# Patient Record
Sex: Female | Born: 1960 | Race: Black or African American | Hispanic: No | Marital: Single | Smoking: Former smoker
Health system: Southern US, Community
[De-identification: ages and names within clinical notes are randomized; demographics above are authoritative.]

## PROBLEM LIST (undated history)

## (undated) DIAGNOSIS — B029 Zoster without complications: Secondary | ICD-10-CM

## (undated) DIAGNOSIS — I10 Essential (primary) hypertension: Secondary | ICD-10-CM

## (undated) HISTORY — PX: BREAST REDUCTION SURGERY: SHX8

---

## 1998-07-21 ENCOUNTER — Other Ambulatory Visit: Admission: RE | Admit: 1998-07-21 | Discharge: 1998-07-21 | Payer: Self-pay | Admitting: Obstetrics

## 1998-08-09 ENCOUNTER — Ambulatory Visit (HOSPITAL_COMMUNITY): Admission: RE | Admit: 1998-08-09 | Discharge: 1998-08-09 | Payer: Self-pay | Admitting: Obstetrics

## 1999-03-06 ENCOUNTER — Ambulatory Visit (HOSPITAL_COMMUNITY): Admission: RE | Admit: 1999-03-06 | Discharge: 1999-03-06 | Payer: Self-pay | Admitting: Obstetrics

## 1999-03-06 ENCOUNTER — Encounter: Payer: Self-pay | Admitting: Obstetrics

## 2000-01-21 ENCOUNTER — Emergency Department (HOSPITAL_COMMUNITY): Admission: EM | Admit: 2000-01-21 | Discharge: 2000-01-21 | Payer: Self-pay | Admitting: *Deleted

## 2001-03-04 ENCOUNTER — Emergency Department (HOSPITAL_COMMUNITY): Admission: EM | Admit: 2001-03-04 | Discharge: 2001-03-04 | Payer: Self-pay | Admitting: Emergency Medicine

## 2002-02-21 ENCOUNTER — Emergency Department (HOSPITAL_COMMUNITY): Admission: EM | Admit: 2002-02-21 | Discharge: 2002-02-21 | Payer: Self-pay | Admitting: Emergency Medicine

## 2003-07-22 ENCOUNTER — Emergency Department (HOSPITAL_COMMUNITY): Admission: AD | Admit: 2003-07-22 | Discharge: 2003-07-22 | Payer: Self-pay | Admitting: Family Medicine

## 2003-12-05 ENCOUNTER — Emergency Department (HOSPITAL_COMMUNITY): Admission: EM | Admit: 2003-12-05 | Discharge: 2003-12-05 | Payer: Self-pay | Admitting: Emergency Medicine

## 2003-12-06 ENCOUNTER — Emergency Department (HOSPITAL_COMMUNITY): Admission: EM | Admit: 2003-12-06 | Discharge: 2003-12-06 | Payer: Self-pay | Admitting: Emergency Medicine

## 2005-11-11 ENCOUNTER — Inpatient Hospital Stay (HOSPITAL_COMMUNITY): Admission: AD | Admit: 2005-11-11 | Discharge: 2005-11-11 | Payer: Self-pay | Admitting: Obstetrics and Gynecology

## 2007-01-06 ENCOUNTER — Emergency Department (HOSPITAL_COMMUNITY): Admission: EM | Admit: 2007-01-06 | Discharge: 2007-01-06 | Payer: Self-pay | Admitting: Emergency Medicine

## 2014-11-05 ENCOUNTER — Encounter (HOSPITAL_COMMUNITY): Payer: Self-pay | Admitting: Emergency Medicine

## 2014-11-05 ENCOUNTER — Emergency Department (HOSPITAL_COMMUNITY)
Admission: EM | Admit: 2014-11-05 | Discharge: 2014-11-05 | Disposition: A | Discharge status: Home / Self Care | Payer: No Typology Code available for payment source | Attending: Emergency Medicine | Admitting: Emergency Medicine

## 2014-11-05 DIAGNOSIS — Z720 Tobacco use: Secondary | ICD-10-CM | POA: Diagnosis not present

## 2014-11-05 DIAGNOSIS — Y92410 Unspecified street and highway as the place of occurrence of the external cause: Secondary | ICD-10-CM | POA: Insufficient documentation

## 2014-11-05 DIAGNOSIS — S3992XA Unspecified injury of lower back, initial encounter: Secondary | ICD-10-CM | POA: Insufficient documentation

## 2014-11-05 DIAGNOSIS — Y9389 Activity, other specified: Secondary | ICD-10-CM | POA: Diagnosis not present

## 2014-11-05 DIAGNOSIS — Y998 Other external cause status: Secondary | ICD-10-CM | POA: Insufficient documentation

## 2014-11-05 DIAGNOSIS — M549 Dorsalgia, unspecified: Secondary | ICD-10-CM

## 2014-11-05 MED ORDER — HYDROCODONE-ACETAMINOPHEN 5-325 MG PO TABS: 1.0000 | ORAL_TABLET | Freq: Four times a day (QID) | ORAL | Status: DC | PRN
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
## 2014-12-28 ENCOUNTER — Encounter (HOSPITAL_COMMUNITY): Payer: Self-pay | Admitting: *Deleted

## 2014-12-28 ENCOUNTER — Emergency Department (HOSPITAL_COMMUNITY)
Admission: EM | Admit: 2014-12-28 | Discharge: 2014-12-28 | Disposition: A | Discharge status: Home / Self Care | Payer: Medicaid Other | Attending: Emergency Medicine | Admitting: Emergency Medicine

## 2014-12-28 DIAGNOSIS — I10 Essential (primary) hypertension: Secondary | ICD-10-CM | POA: Insufficient documentation

## 2014-12-28 DIAGNOSIS — Z720 Tobacco use: Secondary | ICD-10-CM | POA: Diagnosis not present

## 2014-12-28 DIAGNOSIS — I160 Hypertensive urgency: Secondary | ICD-10-CM

## 2014-12-28 LAB — BASIC METABOLIC PANEL
ANION GAP: 12 (ref 5–15)
BUN: 18 mg/dL (ref 6–23)
CHLORIDE: 100 mmol/L (ref 96–112)
CO2: 25 mmol/L (ref 19–32)
CREATININE: 1.1 mg/dL (ref 0.50–1.10)
Calcium: 9.8 mg/dL (ref 8.4–10.5)
GFR, EST AFRICAN AMERICAN: 66 mL/min — AB (ref >90)
GFR, EST NON AFRICAN AMERICAN: 57 mL/min — AB (ref >90)
Glucose, Bld: 125 mg/dL — ABNORMAL HIGH (ref 70–99)
Potassium: 4 mmol/L (ref 3.5–5.1)
Sodium: 137 mmol/L (ref 135–145)

## 2014-12-28 LAB — CBC
HCT: 39.5 % (ref 36.0–46.0)
Hemoglobin: 13.4 g/dL (ref 12.0–15.0)
MCH: 31.8 pg (ref 26.0–34.0)
MCHC: 33.9 g/dL (ref 30.0–36.0)
MCV: 93.8 fL (ref 78.0–100.0)
PLATELETS: 291 10*3/uL (ref 150–400)
RBC: 4.21 MIL/uL (ref 3.87–5.11)
RDW: 13 % (ref 11.5–15.5)
WBC: 6.4 10*3/uL (ref 4.0–10.5)

## 2014-12-28 LAB — I-STAT TROPONIN, ED: Troponin i, poc: 0 ng/mL (ref 0.00–0.08)

## 2014-12-28 MED ORDER — LISINOPRIL 10 MG PO TABS
10.0000 mg | ORAL_TABLET | Freq: Once | ORAL | Status: AC
  Administered 2014-12-28: 10 mg via ORAL
  Filled 2014-12-28: qty 1

## 2014-12-28 MED ORDER — LISINOPRIL-HYDROCHLOROTHIAZIDE 10-12.5 MG PO TABS
1.0000 | ORAL_TABLET | Freq: Every day | ORAL | Status: DC
Start: 2014-12-28 — End: 2022-12-22

## 2014-12-28 NOTE — ED Notes (Signed)
NAD at this time. Pt is stable and leaving with her friend. 

## 2014-12-28 NOTE — ED Provider Notes (Signed)
Medical screening examination/treatment/procedure(s) were performed by non-physician practitioner and as supervising physician I was immediately available for consultation/collaboration.   EKG Interpretation   Date/Time:  Tuesday December 28 2014 15:43:36 EDT Ventricular Rate:  66 PR Interval:  138 QRS Duration: 78 QT Interval:  406 QTC Calculation: 425 R Axis:   -12 Text Interpretation:  Normal sinus rhythm Biatrial enlargement Anterior  infarct , age undetermined Abnormal ECG No old tracing to compare  Confirmed by Rollan Roger  MD-J, Lamonda Noxon ([id number]) on 12/28/2014 5:54:49 PM      Pt has asymptomatic hypertension.  Labs unremarkable. Reviewed prior visit and she was hypertensive last month.  Will start on htn meds.  Discussed outpatient treatment and follow up with patient.  Linwood DibblesJon Shannara Winbush, MD 12/28/14 [phone]

## 2014-12-28 NOTE — ED Provider Notes (Signed)
CSN: [id number]     Arrival date & time 12/28/14  1516 History   First MD Initiated Contact with Patient 12/28/14 1744     Chief Complaint  Patient presents with  . Hypertension     (Consider location/radiation/quality/duration/timing/severity/associated sxs/prior Treatment) Patient is a 54 y.o. female presenting with hypertension. The history is provided by the patient and the spouse. No language interpreter was used.  Hypertension  Ms. Printy is a 54 y.o black female who presents for hypertension that was noticed by her chiropractor during an appointment today. She has been asymptomatic and states she was unaware that her blood pressure was high. Nothing makes it better or worse and she has not taken anything for it prior to arrival.  She has been having some headaches on and off for the past week. She denies any fever, chest pain, vision changes, vertigo, nausea, vomiting, shortness of breath, abdominal pain, or leg swelling.    History reviewed. No pertinent past medical history. History reviewed. No pertinent past surgical history. History reviewed. No pertinent family history. History  Substance Use Topics  . Smoking status: Current Every Day Smoker  . Smokeless tobacco: Not on file  . Alcohol Use: Yes   OB History    No data available     Review of Systems  All other systems reviewed and are negative.     Allergies  Review of patient's allergies indicates no known allergies.  Home Medications   Prior to Admission medications   Medication Sig Start Date End Date Taking? Authorizing Provider  HYDROcodone-acetaminophen (NORCO/VICODIN) 5-325 MG per tablet Take 1-2 tablets by mouth every 6 (six) hours as needed for moderate pain or severe pain. 11/05/14   Roxy Horseman, PA-C  lisinopril-hydrochlorothiazide (ZESTORETIC) 10-12.5 MG per tablet Take 1 tablet by mouth daily. 12/28/14   [employer] Patel-Mills, PA-C   BP 173/97 mmHg  Pulse 60  Temp(Src) 98 F (36.7 C) (Oral)   Resp 18  Ht  (1.626 m)  Wt 149 lb (67.586 kg)  BMI 25.56 kg/m2  SpO2 98% Physical Exam  Constitutional: She is oriented to person, place, and time. She appears well-developed and well-nourished.  HENT:  Head: Normocephalic and atraumatic.  Eyes: Conjunctivae and EOM are normal.  Neck: Normal range of motion. Neck supple.  Cardiovascular: Normal rate, regular rhythm and normal heart sounds.   Pulmonary/Chest: Effort normal and breath sounds normal.  Abdominal: Soft. There is no tenderness.  Musculoskeletal: Normal range of motion.  Neurological: She is alert and oriented to person, place, and time. She has normal strength. No cranial nerve deficit or sensory deficit. Coordination normal.  Sensation is good through bilateral upper and lower extremities.   Skin: Skin is warm and dry.  Nursing note and vitals reviewed.   ED Course  Procedures (including critical care time) Labs Review Labs Reviewed  BASIC METABOLIC PANEL - Abnormal; Notable for the following:    Glucose, Bld 125 (*)    GFR calc non Af Amer 57 (*)    GFR calc Af Amer 66 (*)    All other components within normal limits  CBC  I-STAT TROPOININ, ED    Imaging Review No results found.   EKG Interpretation   Date/Time:  Tuesday December 28 2014 15:43:36 EDT Ventricular Rate:  66 PR Interval:  138 QRS Duration: 78 QT Interval:  406 QTC Calculation: 425 R Axis:   -12 Text Interpretation:  Normal sinus rhythm Biatrial enlargement Anterior  infarct , age undetermined Abnormal ECG No  old tracing to compare  Confirmed by [facility] CenterKNAPP  MD-J, JON [phone] on 12/28/2014 5:54:49 PM      MDM   Final diagnoses:  Asymptomatic hypertensive urgency  Patient presents for asymptomatic hypertension that was noted by her chiropracter today.  She states she has not had this in the past.  Her exam is normal.  Labs are unremarkable. EKG is normal.  I have treated her with lisinopril here and will send her home with zestoretic.   She will f/u up with her pcp and agrees with the plan.      Catha GosselinHanna Patel-Mills, PA-C 12/29/14 0030

## 2014-12-28 NOTE — Discharge Instructions (Signed)
Hypertension °Hypertension, commonly called high blood pressure, is when the force of blood pumping through your arteries is too strong. Your arteries are the blood vessels that carry blood from your heart throughout your body. A blood pressure reading consists of a higher number over a lower number, such as 110/72. The higher number (systolic) is the pressure inside your arteries when your heart pumps. The lower number (diastolic) is the pressure inside your arteries when your heart relaxes. Ideally you want your blood pressure below 120/80. °Hypertension forces your heart to work harder to pump blood. Your arteries may become narrow or stiff. Having hypertension puts you at risk for heart disease, stroke, and other problems.  °RISK FACTORS °Some risk factors for high blood pressure are controllable. Others are not.  °Risk factors you cannot control include:  °· Race. You may be at higher risk if you are African American. °· Age. Risk increases with age. °· Gender. Men are at higher risk than women before age 45 years. After age 65, women are at higher risk than men. °Risk factors you can control include: °· Not getting enough exercise or physical activity. °· Being overweight. °· Getting too much fat, sugar, calories, or salt in your diet. °· Drinking too much alcohol. °SIGNS AND SYMPTOMS °Hypertension does not usually cause signs or symptoms. Extremely high blood pressure (hypertensive crisis) may cause headache, anxiety, shortness of breath, and nosebleed. °DIAGNOSIS  °To check if you have hypertension, your health care provider will measure your blood pressure while you are seated, with your arm held at the level of your heart. It should be measured at least twice using the same arm. Certain conditions can cause a difference in blood pressure between your right and left arms. A blood pressure reading that is higher than normal on one occasion does not mean that you need treatment. If one blood pressure reading  is high, ask your health care provider about having it checked again. °TREATMENT  °Treating high blood pressure includes making lifestyle changes and possibly taking medicine. Living a healthy lifestyle can help lower high blood pressure. You may need to change some of your habits. °Lifestyle changes may include: °· Following the DASH diet. This diet is high in fruits, vegetables, and whole grains. It is low in salt, red meat, and added sugars. °· Getting at least 2½ hours of brisk physical activity every week. °· Losing weight if necessary. °· Not smoking. °· Limiting alcoholic beverages. °· Learning ways to reduce stress. ° If lifestyle changes are not enough to get your blood pressure under control, your health care provider may prescribe medicine. You may need to take more than one. Work closely with your health care provider to understand the risks and benefits. °HOME CARE INSTRUCTIONS °· Have your blood pressure rechecked as directed by your health care provider.   °· Take medicines only as directed by your health care provider. Follow the directions carefully. Blood pressure medicines must be taken as prescribed. The medicine does not work as well when you skip doses. Skipping doses also puts you at risk for problems.   °· Do not smoke.   °· Monitor your blood pressure at home as directed by your health care provider.  °SEEK MEDICAL CARE IF:  °· You think you are having a reaction to medicines taken. °· You have recurrent headaches or feel dizzy. °· You have swelling in your ankles. °· You have trouble with your vision. °SEEK IMMEDIATE MEDICAL CARE IF: °· You develop a severe headache or confusion. °·   You have unusual weakness, numbness, or feel faint. °· You have severe chest or abdominal pain. °· You vomit repeatedly. °· You have trouble breathing. °MAKE SURE YOU:  °· Understand these instructions. °· Will watch your condition. °· Will get help right away if you are not doing well or get worse. °Document  Released: 09/17/2005 Document Revised: 02/01/2014 Document Reviewed: 07/10/2013 °ExitCare® Patient Information ©2015 [employer]. This information is not intended to replace advice given to you by your health care provider. Make sure you discuss any questions you have with your health care provider. ° ° °Emergency Department Resource Guide °1) Find a Doctor and Pay Out of Pocket °Although you won't have to find out who is covered by your insurance plan, it is a good idea to ask around and get recommendations. You will then need to call the office and see if the doctor you have chosen will accept you as a new patient and what types of options they offer for patients who are self-pay. Some doctors offer discounts or will set up payment plans for their patients who do not have insurance, but you will need to ask so you aren't surprised when you get to your appointment. ° °2) Contact Your Local Health Department °Not all health departments have doctors that can see patients for sick visits, but many do, so it is worth a call to see if yours does. If you don't know where your local health department is, you can check in your phone book. The CDC also has a tool to help you locate your state's health department, and many state websites also have listings of all of their local health departments. ° °3) Find a Walk-in Clinic °If your illness is not likely to be very severe or complicated, you may want to try a walk in clinic. These are popping up all over the country in pharmacies, drugstores, and shopping centers. They're usually staffed by nurse practitioners or physician assistants that have been trained to treat common illnesses and complaints. They're usually fairly quick and inexpensive. However, if you have serious medical issues or chronic medical problems, these are probably not your best option. ° °No Primary Care Doctor: °- Call [facility] at  832-8000 - they can help you locate a primary care doctor that   accepts your insurance, provides certain services, etc. °- Physician Referral Service- 1-800-533-3463 ° °Chronic Pain Problems: °Organization         Address  Phone   Notes  °[facility]  (336) 297-2271 Patients need to be referred by their primary care doctor.  ° °Medication Assistance: °Organization         Address  Phone   Notes  °[place] [facility] [address] °[place], [place] [address] (336) 641-8030 --Must be a resident of [place] °-- Must have NO insurance coverage whatsoever (no Medicaid/ Medicare, etc.) °-- The pt. MUST have a primary care doctor that directs their care regularly and follows them in the community °  °[facility]  (866) 331-1348   °[employer]  (888) 892-1162   ° °Agencies that provide inexpensive medical care: °Organization         Address  Phone   Notes  °[facility]  (336) 832-8035   °[facility]    (336) 832-7272   °[facility] [address] °[place], [place] [address] (336) 832-4777   °[facility] [address], °  [place] (336) 271-4999   °[facility]    (336) 373-0678   °[facility]    (336) 272-1050   °[facility] ° [address], [place] Phone:  (336) 832-4444, Fax:  (336) 832-4440 Hours of Operation:  9 am - 6 pm, M-F.  Also accepts Medicaid/Medicare and self-pay.  °[facility] ° [address], [place] Phone: (336) 832-3150, Fax: (336) 832-3151. Hours of Operation:  8:30 am - 5:30 pm, M-F.  Also accepts Medicaid and self-pay.  °[facility] [place] [address], [place] Phone: (336) 878-6027   °[facility] [address], [place], [place] (336)723-1848, Ext. 123 Mondays & Thursdays: 7-9 AM.  First 15 patients are seen on a first come, first serve basis. °  ° °Medicaid-accepting [place] Providers: ° °Organization          Address  Phone   Notes  °[facility] [address], [place] (336) 641-2100 Also accepts self-pay patients.  °[facility] [address], [place] ° (336) 856-9996   °[facility] [address], [place] (336) 288-8857   °[facility] [address] [place] [address], [place] (336) 299-7000   °Veita Bland [address], [place]  ° (336) 373-1557 Only accepts [place] Access Medicaid patients after they have their name applied to their card.  ° °Self-Pay (no insurance) in [place]: ° °Organization         Address  Phone   Notes  °Sickle Cell Patients, [facility] [address], [place] (336) 832-1970   ° [facility] [address], [place] (336) 832-4400   ° [facility] ° [address], [place] (336) 992-4800   °[facility]/Dr. Osei-Bonsu ° [address] [place] [address], [place] or [address], [place] (336) 841-8500 Phone number for both [place] and [place] locations is the same.  °[facility] [address], [place] (336) 299-0000   °[facility] [address] [place] [address], [place] or [address] (336) 852-7530 °(336) 878-2260   °[facility] [address], [place] (336) 350-1642, phone; (336) 294-5005, fax Sees patients 1st and 3rd Saturday of every month.  Must not qualify for public or private insurance (i.e. Medicaid, Medicare, [place] Health Choice, Veterans' Benefits) • Household income should be no more than 200% of the poverty level •The clinic cannot treat you if you are pregnant or think you are pregnant • Sexually transmitted diseases are not treated at the clinic.  ° ° °Dental Care: °Organization         Address  Phone  Notes  °[facility] [address],   (336) 641-6152 Accepts children up to age 21 who are enrolled in Medicaid or [place] Health Choice; pregnant women with a Medicaid card; and children who have applied for Medicaid or [place] Health Choice, but were declined, whose parents can pay a reduced fee at time of service.  °[facility] [place]  [address], [place] (336) 641-7733 Accepts children up to age 21 who are enrolled in Medicaid or [place] Health Choice; pregnant women with a Medicaid card; and children who have applied for Medicaid or [place]   Health Choice, but were declined, whose parents can pay a reduced fee at time of service.  °[facility] ° [address], [place] (336) 641-4533 Patients are seen by appointment only. Walk-ins are not accepted. [facility] will see patients 18 years of age and older. °Monday - Tuesday (8am-5pm) °Most Wednesdays (8:30-5pm) °$30 per visit, cash only  °[facility] ° [address], [place] (336) 641-4533 Patients are seen by appointment only. Walk-ins are not accepted. [facility] will see patients 18 years of age and older. °One Wednesday Evening (Monthly: Volunteer Based).  $30 per visit, cash only  °[facility]  (919) [phone] for adults; Children under age 4, call Graduate Pediatric Dentistry at ([phone]) [phone]. Children aged 4-14, please call ([phone]) [phone] to request a pediatric application. ° Dental services are provided in all areas of dental care including fillings, crowns and bridges, complete and partial dentures, implants, gum treatment, root canals, and extractions. Preventive care is also provided. Treatment is provided to both adults and children. °Patients are selected via a lottery and there is often a waiting list. °  °[facility] [address], °[place] ° (336) 763-8833 [email or web address] °  °[facility] [address], [place], [place]  (336)723-1848, Ext. 123 Second and Fourth Thursday of each month, opens at 6:30 AM; Clinic ends at 9 AM.  Patients are seen on a first-come first-served basis, and a limited number are seen during each clinic.  ° °[facility] ° [address], [place], [place] (336) 723-7904   Eligibility Requirements °You must have lived in [place], [place], or [place] counties for at least the last three months. °  You cannot be eligible for state or federal sponsored healthcare insurance, including Veterans Administration, Medicaid, or Medicare. °  You generally cannot be eligible for healthcare insurance through your employer.  °  How to apply: °Eligibility screenings are held every Tuesday and Wednesday afternoon from 1:00 pm until 4:00 pm. You do not need an appointment for the interview!  °[facility] [address], [place], [place] 336-631-2330   °[facility]  336-342-8273   °[facility]  336-703-3100   °[facility]  336-570-6415   ° °Behavioral Health Resources in the Community: °Intensive Outpatient Programs °Organization         Address  Phone  Notes  °[facility] [address], [place], [place] 336-878-6098   °[facility] [address], [place], [place] 336-832-9800   °[facility] [address], [place], [place] ° 336-882-2125   °[facility] [address],  °[place], [place] 1-800-853-5163 or 336-641-4981   °Substance Abuse Resources °Organization         Address  Phone  Notes  °[facility]  336-882-2125   °[facility]  336-784-9470   °[facility]  336-285-9073   °[facility]  336-845-3988   °Residential & Outpatient Substance Abuse Program  1-800-659-3381   °Psychological Services °Organization         Address  Phone  Notes  °[facility]  336- 832-9600   °[facility]  336- 378-7881    °[facility] [address],  1-800-853-5163 or 336-641-4981   ° °Mobile Crisis Teams °Organization         Address  Phone    Notes  °[facility], Mobile Crisis Care Unit  1-877-626-1772   °Assertive °[facility] ° [address] [place], [place] 336-834-9664   °Sharon DeEsch [address] °[place] 336-554-5454   ° °Self-Help/Support Groups °Organization         Address  Phone             Notes  °[facility] - variety of support groups  336- 373-1402 Call for more information  °Narcotics Anonymous (NA), [facility] [address], °[place]  2 meetings at this location  ° °Residential Treatment Programs °Organization         Address  Phone  Notes  °[facility] [address],    °[place]  1-866-801-8205   °[facility] ° [address], [place], [place] 704-293-8524   °[facility] [address], [place] 336-845-3988 Admissions: 8am-3pm M-F  °[facility] [address]    °[place], [place] 336-841-1104   °[facility] [address], [place], [place] 336-379-7146   °[facility] [address]  °[place], [place] 336-285-9073   °[facility] - Intensive Outpatient [address], [place], [place] 336-852-3033   °[facility] [address]  °[place], [place] 1-877-615-2722 or 336-784-9470   °[facility] ([facility] [address] [place], [place] 336-227-7417 Accepts Medicaid  °[facility] [address]  ° [place] 1-800-659-3381 Substance Abuse/Addiction Treatment  ° °[place] Behavioral Health Resources °Organization         Address  Phone  Notes  °[facility]  (888) 581-9988   °Julie Brannon, PhD [address] [place], [place]   (336) 349-5553 or (336) 951-0000   °[facility]   [address] °[place], [place] (336) 349-4454   °[facility] [address], [place], [place] (336) 342-8316 Insurance/Medicaid/sponsorship through [facility]  °[facility] [address]                                    [place], [place] (336) 342-8316 Therapy/tele-psych/case  °[facility] [address]  ° [place], [place] (336) 349-2233    °Dr. Arfeen  (336) 349-4544   °[facility] [place]  [employer] [place] [facility] 1) [address],  °2) [address], [place] °3)  [address], [place] (336) 349-3220 °(336) 342-7768 ° °(336) 342-8140   °[place] [facility] (336) 342-1394 or (336) 342-3537 (After Hours)    ° ° ° °

## 2014-12-28 NOTE — ED Notes (Signed)
Pt reports going to chiropractor today, was sent here due to htn. Denies hx of htn. Having headache and blurred vision. No acute distress noted at this time.

## 2015-01-05 ENCOUNTER — Emergency Department (HOSPITAL_COMMUNITY): Payer: Medicaid Other

## 2015-01-05 ENCOUNTER — Encounter (HOSPITAL_COMMUNITY): Payer: Self-pay | Admitting: Family Medicine

## 2015-01-05 ENCOUNTER — Emergency Department (HOSPITAL_COMMUNITY)
Admission: EM | Admit: 2015-01-05 | Discharge: 2015-01-05 | Disposition: A | Discharge status: Home / Self Care | Payer: Medicaid Other | Attending: Emergency Medicine | Admitting: Emergency Medicine

## 2015-01-05 DIAGNOSIS — Y9289 Other specified places as the place of occurrence of the external cause: Secondary | ICD-10-CM | POA: Diagnosis not present

## 2015-01-05 DIAGNOSIS — W01198A Fall on same level from slipping, tripping and stumbling with subsequent striking against other object, initial encounter: Secondary | ICD-10-CM | POA: Insufficient documentation

## 2015-01-05 DIAGNOSIS — Z79899 Other long term (current) drug therapy: Secondary | ICD-10-CM | POA: Diagnosis not present

## 2015-01-05 DIAGNOSIS — Y998 Other external cause status: Secondary | ICD-10-CM | POA: Insufficient documentation

## 2015-01-05 DIAGNOSIS — S0083XA Contusion of other part of head, initial encounter: Secondary | ICD-10-CM | POA: Diagnosis not present

## 2015-01-05 DIAGNOSIS — Y9389 Activity, other specified: Secondary | ICD-10-CM | POA: Insufficient documentation

## 2015-01-05 DIAGNOSIS — S060X0A Concussion without loss of consciousness, initial encounter: Secondary | ICD-10-CM | POA: Diagnosis not present

## 2015-01-05 DIAGNOSIS — Z720 Tobacco use: Secondary | ICD-10-CM | POA: Diagnosis not present

## 2015-01-05 DIAGNOSIS — S20212A Contusion of left front wall of thorax, initial encounter: Secondary | ICD-10-CM | POA: Diagnosis not present

## 2015-01-05 DIAGNOSIS — S0990XA Unspecified injury of head, initial encounter: Secondary | ICD-10-CM | POA: Diagnosis present

## 2015-01-05 LAB — I-STAT CHEM 8, ED
BUN: 20 mg/dL (ref 6–23)
CHLORIDE: 103 mmol/L (ref 96–112)
Calcium, Ion: 1.19 mmol/L (ref 1.12–1.23)
Creatinine, Ser: 1.1 mg/dL (ref 0.50–1.10)
GLUCOSE: 111 mg/dL — AB (ref 70–99)
HEMATOCRIT: 45 % (ref 36.0–46.0)
Hemoglobin: 15.3 g/dL — ABNORMAL HIGH (ref 12.0–15.0)
POTASSIUM: 3.7 mmol/L (ref 3.5–5.1)
Sodium: 141 mmol/L (ref 135–145)
TCO2: 24 mmol/L (ref 0–100)

## 2015-01-05 NOTE — ED Notes (Signed)
Pt placed on 2L because on room air pt destat to 80%. Pt now at 100% with 2L

## 2015-01-05 NOTE — ED Notes (Signed)
Patient transported to CT 

## 2015-01-05 NOTE — Discharge Instructions (Signed)
Concussion Take Tylenol or Advil for pain. See your primary care physician if you don't feel back to normal by Monday, 01/10/2015 A concussion is a brain injury. It is caused by:  A hit to the head.  A quick and sudden movement (jolt) of the head or neck. A concussion is usually not life threatening. Even so, it can cause serious problems. If you had a concussion before, you may have concussion-like problems after a hit to your head. HOME CARE General Instructions  Follow your doctor's directions carefully.  Take medicines only as told by your doctor.  Only take medicines your doctor says are safe.  Do not drink alcohol until your doctor says it is okay. Alcohol and some drugs can slow down healing. They can also put you at risk for further injury.  If you are having trouble remembering things, write them down.  Try to do one thing at a time if you get distracted easily. For example, do not watch TV while making dinner.  Talk to your family members or close friends when making important decisions.  Follow up with your doctor as told.  Watch your symptoms. Tell others to do the same. Serious problems can sometimes happen after a concussion. Older adults are more likely to have these problems.  Tell your teachers, school nurse, school counselor, coach, Event organiserathletic trainer, or work Production designer, theatre/television/filmmanager about your concussion. Tell them about what you can or cannot do. They should watch to see if:  It gets even harder for you to pay attention or concentrate.  It gets even harder for you to remember things or learn new things.  You need more time than normal to finish things.  You become annoyed (irritable) more than before.  You are not able to deal with stress as well.  You have more problems than before.  Rest. Make sure you:  Get plenty of sleep at night.  Go to sleep early.  Go to bed at the same time every day. Try to wake up at the same time.  Rest during the day.  Take naps  when you feel tired.  Limit activities where you have to think a lot or concentrate. These include:  Doing homework.  Doing work related to a job.  Watching TV.  Using the computer. Returning To Your Regular Activities Return to your normal activities slowly, not all at once. You must give your body and brain enough time to heal.   Do not play sports or do other athletic activities until your doctor says it is okay.  Ask your doctor when you can drive, ride a bicycle, or work other vehicles or machines. Never do these things if you feel dizzy.  Ask your doctor about when you can return to work or school. Preventing Another Concussion It is very important to avoid another brain injury, especially before you have healed. In rare cases, another injury can lead to permanent brain damage, brain swelling, or death. The risk of this is greatest during the first 7-10 days after your injury. Avoid injuries by:   Wearing a seat belt when riding in a car.  Not drinking too much alcohol.  Avoiding activities that could lead to a second concussion (such as contact sports).  Wearing a helmet when doing activities like:  Biking.  Skiing.  Skateboarding.  Skating.  Making your home safer by:  Removing things from the floor or stairways that could make you trip.  Using grab bars in bathrooms and handrails by  stairs.  Placing non-slip mats on floors and in bathtubs.  Improve lighting in dark areas. GET HELP IF:  It gets even harder for you to pay attention or concentrate.  It gets even harder for you to remember things or learn new things.  You need more time than normal to finish things.  You become annoyed (irritable) more than before.  You are not able to deal with stress as well.  You have more problems than before.  You have problems keeping your balance.  You are not able to react quickly when you should. Get help if you have any of these problems for more than 2  weeks:   Lasting (chronic) headaches.  Dizziness or trouble balancing.  Feeling sick to your stomach (nausea).  Seeing (vision) problems.  Being affected by noises or light more than normal.  Feeling sad, low, down in the dumps, blue, gloomy, or empty (depressed).  Mood changes (mood swings).  Feeling of fear or nervousness about what may happen (anxiety).  Feeling annoyed.  Memory problems.  Problems concentrating or paying attention.  Sleep problems.  Feeling tired all the time. GET HELP RIGHT AWAY IF:   You have bad headaches or your headaches get worse.  You have weakness (even if it is in one hand, leg, or part of the face).  You have loss of feeling (numbness).  You feel off balance.  You keep throwing up (vomiting).  You feel tired.  One black center of your eye (pupil) is larger than the other.  You twitch or shake violently (convulse).  Your speech is not clear (slurred).  You are more confused, easily angered (agitated), or annoyed than before.  You have more trouble resting than before.  You are unable to recognize people or places.  You have neck pain.  It is difficult to wake you up.  You have unusual behavior changes.  You pass out (lose consciousness). MAKE SURE YOU:   Understand these instructions.  Will watch your condition.  Will get help right away if you are not doing well or get worse. Document Released: 09/05/2009 Document Revised: 02/01/2014 Document Reviewed: 04/09/2013 [facility] Patient Information 2015 [place], [place]. This information is not intended to replace advice given to you by your health care provider. Make sure you discuss any questions you have with your health care provider.

## 2015-01-05 NOTE — ED Provider Notes (Signed)
CSN: [id number]     Arrival date & time 01/05/15  1314 History   None    Chief Complaint  Patient presents with  . Fall     (Consider location/radiation/quality/duration/timing/severity/associated sxs/prior Treatment) HPI Patient reports she fell 3 AM on 01/01/2014 when she got up out of bed. She lost her balance and struck her head and her chest and lay on the floor until daylight. She reports feeling lightheaded since the fall. Denies abdominal pain no shortness of breath. No treatment prior to coming here. No other associated symptoms.. Chest pain is worse with lying in certain positions improved with other positions. Discomfort is presently mild.  History reviewed. No pertinent past medical history. past medical history hypertension History reviewed. No pertinent past surgical history. History reviewed. No pertinent family history. History  Substance Use Topics  . Smoking status: Current Every Day Smoker  . Smokeless tobacco: Not on file  . Alcohol Use: Yes   OB History    No data available     Review of Systems  Cardiovascular: Positive for chest pain.  Neurological: Positive for light-headedness and headaches.  All other systems reviewed and are negative.     Allergies  Review of patient's allergies indicates no known allergies.  Home Medications   Prior to Admission medications   Medication Sig Start Date End Date Taking? Authorizing Provider  lisinopril-hydrochlorothiazide (ZESTORETIC) 10-12.5 MG per tablet Take 1 tablet by mouth daily. 12/28/14  Yes Hanna Patel-Mills, PA-C  HYDROcodone-acetaminophen (NORCO/VICODIN) 5-325 MG per tablet Take 1-2 tablets by mouth every 6 (six) hours as needed for moderate pain or severe pain. Patient not taking: Reported on 01/05/2015 11/05/14   Roxy Horsemanobert Browning, PA-C   BP 171/78 mmHg  Pulse 71  Temp(Src) 97.9 F (36.6 C) (Oral)  Resp 18  SpO2 99% Physical Exam  Constitutional: She is oriented to person, place, and time. She appears  well-developed and well-nourished.  HENT:  Mildly swollen tender overlying right eyebrow otherwise Mechele Claudehomas Fike atraumatic  Eyes: Conjunctivae are normal. Pupils are equal, round, and reactive to light.  Neck: Neck supple. No tracheal deviation present. No thyromegaly present.  Cardiovascular: Normal rate and regular rhythm.   No murmur heard. Pulmonary/Chest: Effort normal and breath sounds normal.  3 cm reddish ecchymosis at left parasternal area with mild tenderness corresponding. No crepitance no flail  Abdominal: Soft. Bowel sounds are normal. She exhibits no distension. There is no tenderness.  Musculoskeletal: Normal range of motion. She exhibits no edema or tenderness.  Entire spine nontender.  Neurological: She is alert and oriented to person, place, and time. She has normal reflexes. No cranial nerve deficit. Coordination normal.  Glasgow Coma Score 15 gait normal Romberg normal pronator drift normal DTR symmetric bilaterally knee jerk ankle jerk and biceps toes downward going bilaterally  Skin: Skin is warm and dry. No rash noted.  Psychiatric: She has a normal mood and affect.  Nursing note and vitals reviewed.   ED Course  Procedures (including critical care time) Labs Review Labs Reviewed - No data to display  Imaging Review No results found.   EKG Interpretation None     4:40 PM patient resting comfortably. Results for orders placed or performed during the hospital encounter of 01/05/15  I-stat chem 8, ed  Result Value Ref Range   Sodium 141 135 - 145 mmol/L   Potassium 3.7 3.5 - 5.1 mmol/L   Chloride 103 96 - 112 mmol/L   BUN 20 6 - 23 mg/dL   Creatinine,  Ser 1.10 0.50 - 1.10 mg/dL   Glucose, Bld 161 (H) 70 - 99 mg/dL   Calcium, Ion 0.96 0.45 - 1.23 mmol/L   TCO2 24 0 - 100 mmol/L   Hemoglobin 15.3 (H) 12.0 - 15.0 g/dL   HCT 40.9 81.1 - 91.4 %   Dg Chest 2 View  01/05/2015   CLINICAL DATA:  Dizziness and shortness of breath; cough  EXAM: CHEST  2 VIEW   COMPARISON:  None.  FINDINGS: Lungs are clear. Heart size and pulmonary vascularity are normal. No adenopathy. No bone lesions.  IMPRESSION: No edema or consolidation.   Electronically Signed   By: Bretta Bang III M.D.   On: 01/05/2015 16:08   Ct Head Wo Contrast  01/05/2015   CLINICAL DATA:  Dizziness since Sunday. Larey Seat while going to the bathroom. Loss of consciousness. Injury to the head with hematoma along the right forehead.  EXAM: CT HEAD WITHOUT CONTRAST  TECHNIQUE: Contiguous axial images were obtained from the base of the skull through the vertex without intravenous contrast.  COMPARISON:  Maxillofacial CT from 12/05/2003  FINDINGS: The brainstem, cerebellum, cerebral peduncles, thalamus, basal ganglia, basilar cisterns, and ventricular system appear within normal limits. No intracranial hemorrhage, mass lesion, or acute CVA.  Chronic left maxillary sinus sinusitis with subtotal opacification of the sinus. The remaining visualized paranasal sinuses appear clear.  IMPRESSION: 1. No acute intracranial findings. 2. Chronic left maxillary sinusitis.   Electronically Signed   By: Gaylyn Rong M.D.   On: 01/05/2015 15:57    MDM  Plan Tylenol or Advil for pain. Note pulse oximetry of 80% on room air is felt to be false reading. Patient maintained pulse oximetry of 98-100% on room air throughout entire ED course. Final diagnoses:  None   plan Tylenol or Advil for pain. Follow-up with PMD if not feeling better in 4 or 5 days Diagnosis #1 fall #2 mild concussion #3 contusion to chest wall      Doug Sou, MD 01/05/15 1642

## 2015-01-05 NOTE — ED Notes (Signed)
Pt here for fall on Sunday. Sts that she tripped and hit right side of head and chest on her dresser. sts feeling a little woozy since then and having pain in head and chest. sts bruise to chest.

## 2015-02-12 ENCOUNTER — Emergency Department (HOSPITAL_COMMUNITY)
Admission: EM | Admit: 2015-02-12 | Discharge: 2015-02-12 | Disposition: A | Discharge status: Home / Self Care | Payer: Medicaid Other | Attending: Emergency Medicine | Admitting: Emergency Medicine

## 2015-02-12 ENCOUNTER — Emergency Department (HOSPITAL_COMMUNITY): Payer: Medicaid Other

## 2015-02-12 ENCOUNTER — Encounter (HOSPITAL_COMMUNITY): Payer: Self-pay | Admitting: *Deleted

## 2015-02-12 DIAGNOSIS — R062 Wheezing: Secondary | ICD-10-CM | POA: Diagnosis not present

## 2015-02-12 DIAGNOSIS — Z79899 Other long term (current) drug therapy: Secondary | ICD-10-CM | POA: Diagnosis not present

## 2015-02-12 DIAGNOSIS — Y939 Activity, unspecified: Secondary | ICD-10-CM | POA: Insufficient documentation

## 2015-02-12 DIAGNOSIS — R0781 Pleurodynia: Secondary | ICD-10-CM | POA: Insufficient documentation

## 2015-02-12 DIAGNOSIS — Z720 Tobacco use: Secondary | ICD-10-CM | POA: Diagnosis not present

## 2015-02-12 DIAGNOSIS — B029 Zoster without complications: Secondary | ICD-10-CM | POA: Diagnosis not present

## 2015-02-12 DIAGNOSIS — S1096XA Insect bite of unspecified part of neck, initial encounter: Secondary | ICD-10-CM | POA: Insufficient documentation

## 2015-02-12 DIAGNOSIS — W57XXXA Bitten or stung by nonvenomous insect and other nonvenomous arthropods, initial encounter: Secondary | ICD-10-CM | POA: Diagnosis not present

## 2015-02-12 DIAGNOSIS — Y929 Unspecified place or not applicable: Secondary | ICD-10-CM | POA: Insufficient documentation

## 2015-02-12 DIAGNOSIS — Y999 Unspecified external cause status: Secondary | ICD-10-CM | POA: Diagnosis not present

## 2015-02-12 DIAGNOSIS — R05 Cough: Secondary | ICD-10-CM | POA: Insufficient documentation

## 2015-02-12 MED ORDER — ACYCLOVIR 400 MG PO TABS: 400.0000 mg | ORAL_TABLET | Freq: Four times a day (QID) | ORAL | Status: DC

## 2015-02-12 MED ORDER — PREDNISONE 20 MG PO TABS: 40.0000 mg | ORAL_TABLET | Freq: Every day | ORAL | Status: DC

## 2015-02-12 MED ORDER — HYDROCODONE-ACETAMINOPHEN 5-325 MG PO TABS: 1.0000 | ORAL_TABLET | Freq: Four times a day (QID) | ORAL | Status: DC | PRN

## 2015-02-12 MED ORDER — HYDROCODONE-ACETAMINOPHEN 5-325 MG PO TABS
1.0000 | ORAL_TABLET | Freq: Once | ORAL | Status: AC
  Administered 2015-02-12: 1 via ORAL
  Filled 2015-02-12: qty 1

## 2015-02-12 MED ORDER — HYDROCORTISONE 1 % EX CREA: TOPICAL_CREAM | CUTANEOUS | Status: DC

## 2015-02-12 NOTE — ED Provider Notes (Signed)
CSN: [id number]     Arrival date & time 02/12/15  2055 History  This chart was scribed for Jaynie Crumbleatyana Alann Avey, PA-C working with Dr. Preston FleetingGlick, MD by Evon Slackerrance Branch, ED Scribe. This patient was seen in room TR11C/TR11C and the patient's care was started at 9:27 PM.      Chief Complaint  Patient presents with  . pimple neck     The history is provided by the patient. No language interpreter was used.   HPI Comments: Ashlee Carlson is a 54 y.o. female who presents to the Emergency Department complaining of worsening rash on her posterior neck onset 3 days prior. Pt states she had subjective fever and cough. She states she felt like her neck was swollen. She is also reporting pain to the right flank. Pt states that the pain is worse with movement.  Pt doesn't report any medications PTA. She states that her husband has also recently been diagnosed with singles. She denies any rash to the flank at this time. She states areas very sensitive to light touch. Denies SOB or other related symptoms. No fever or chills. No headaches.  History reviewed. No pertinent past medical history. History reviewed. No pertinent past surgical history. No family history on file. History  Substance Use Topics  . Smoking status: Current Every Day Smoker  . Smokeless tobacco: Not on file  . Alcohol Use: Yes   OB History    No data available     Review of Systems  Constitutional: Negative for chills.  Respiratory: Positive for cough. Negative for shortness of breath.   Skin: Positive for rash.  All other systems reviewed and are negative.   Allergies  Review of patient's allergies indicates no known allergies.  Home Medications   Prior to Admission medications   Medication Sig Start Date End Date Taking? Authorizing Provider  HYDROcodone-acetaminophen (NORCO/VICODIN) 5-325 MG per tablet Take 1-2 tablets by mouth every 6 (six) hours as needed for moderate pain or severe pain. Patient not taking: Reported on  01/05/2015 11/05/14   Roxy Horsemanobert Browning, PA-C  lisinopril-hydrochlorothiazide (ZESTORETIC) 10-12.5 MG per tablet Take 1 tablet by mouth daily. 12/28/14   Hanna Patel-Mills, PA-C   BP 167/95 mmHg  Pulse 67  Temp(Src) 98.2 F (36.8 C) (Oral)  Resp 18  Ht 5\' 5"  (1.651 m)  Wt 162 lb 9 oz (73.738 kg)  BMI 27.05 kg/m2  SpO2 97%   Physical Exam  Constitutional: She is oriented to person, place, and time. She appears well-developed and well-nourished. No distress.  HENT:  Head: Normocephalic and atraumatic.  Eyes: Conjunctivae and EOM are normal.  Neck: Neck supple. No tracheal deviation present.  Cardiovascular: Normal rate, regular rhythm, normal heart sounds and intact distal pulses.   Pulmonary/Chest: Effort normal. No respiratory distress. She has wheezes. She has rales.  Musculoskeletal: Normal range of motion.  Neurological: She is alert and oriented to person, place, and time.  Skin: Skin is warm and dry.  Small papule to the posterior neck with no erythema, no fluctuance, no surrounding erythema or cellulitis. Normal right flank, no rashes. Very sensitive to the light touch. Describes burning sensation.  Psychiatric: She has a normal mood and affect. Her behavior is normal.  Nursing note and vitals reviewed.   ED Course  Procedures (including critical care time) DIAGNOSTIC STUDIES: Oxygen Saturation is 97% on RA, normal by my interpretation.    COORDINATION OF CARE: 9:39 PM-Discussed treatment plan with pt at bedside and pt agreed to plan.  Labs Review Labs Reviewed - No data to display  Imaging Review Dg Chest 2 View  02/12/2015   CLINICAL DATA:  Patient was bitten by an insect on Wednesday and has a vesicle on the back of her neck with swelling in the right neck and pain in the right arm. Right rib pain.  EXAM: CHEST  2 VIEW  COMPARISON:  01/05/2015  FINDINGS: The heart size and mediastinal contours are within normal limits. Both lungs are clear. The visualized skeletal  structures are unremarkable.  IMPRESSION: No active cardiopulmonary disease.   Electronically Signed   By: Burman NievesWilliam  Stevens M.D.   On: 02/12/2015 22:51     EKG Interpretation None      MDM   Final diagnoses:  Shingles  Insect bite    Patient with a small papule to the neck, I do not see any evidence of cellulitis or infection. This could be a bite, but it appears to be healing well. Advised to apply hydrocortisone cream. Patient is also complaining of right ribs pain. I do not see any rashes. Patient states she has been coughing some but denies any chest pain or shortness of breath other than pain over the ribs. Area pain reproduced with even light touch. States areas very sensitive. Patient's husband just had shingles, I have high suspicion for shingles in this patient as well given her symptoms and exam findings. I will start her on minocycline your prednisone. Norco for pain. I explained to her if she develops rash this is most likely shingles. Chest x-ray was obtained given she is having cough and pain, and it is negative. Her vital signs are normal other than hypertension. Instructed to follow with a primary care doctor for close recheck. Patient agreed.  Filed Vitals:   02/12/15 2118 02/12/15 2320  BP: 167/95 145/85  Pulse: 67 78  Temp: 98.2 F (36.8 C) 98.2 F (36.8 C)  TempSrc: Oral Oral  Resp: 18 18  Height: 5\' 5"  (1.651 m)   Weight: 162 lb 9 oz (73.738 kg)   SpO2: 97% 98%    I personally performed the services described in this documentation, which was scribed in my presence. The recorded information has been reviewed and is accurate.      Jaynie Crumbleatyana Layman Gully, PA-C 02/12/15 2322  Dione Boozeavid Glick, MD 02/12/15 [phone]

## 2015-02-12 NOTE — Discharge Instructions (Signed)
Hydrocortisone cream to the back of your neck for the bite. Take a cycle of year and prednisone as prescribed for possible shingles. Take Norco for severe pain only. Please follow-up with your primary care doctor for recheck. Return if her symptoms are worsening.  Shingles Shingles (herpes zoster) is an infection that is caused by the same virus that causes chickenpox (varicella). The infection causes a painful skin rash and fluid-filled blisters, which eventually break open, crust over, and heal. It may occur in any area of the body, but it usually affects only one side of the body or face. The pain of shingles usually lasts about 1 month. However, some people with shingles may develop long-term (chronic) pain in the affected area of the body. Shingles often occurs many years after the person had chickenpox. It is more common:  In people older than 50 years.  In people with weakened immune systems, such as those with HIV, AIDS, or cancer.  In people taking medicines that weaken the immune system, such as transplant medicines.  In people under great stress. CAUSES  Shingles is caused by the varicella zoster virus (VZV), which also causes chickenpox. After a person is infected with the virus, it can remain in the person's body for years in an inactive state (dormant). To cause shingles, the virus reactivates and breaks out as an infection in a nerve root. The virus can be spread from person to person (contagious) through contact with open blisters of the shingles rash. It will only spread to people who have not had chickenpox. When these people are exposed to the virus, they may develop chickenpox. They will not develop shingles. Once the blisters scab over, the person is no longer contagious and cannot spread the virus to others. SIGNS AND SYMPTOMS  Shingles shows up in stages. The initial symptoms may be pain, itching, and tingling in an area of the skin. This pain is usually described as burning,  stabbing, or throbbing.In a few days or weeks, a painful red rash will appear in the area where the pain, itching, and tingling were felt. The rash is usually on one side of the body in a band or belt-like pattern. Then, the rash usually turns into fluid-filled blisters. They will scab over and dry up in approximately 2-3 weeks. Flu-like symptoms may also occur with the initial symptoms, the rash, or the blisters. These may include:  Fever.  Chills.  Headache.  Upset stomach. DIAGNOSIS  Your health care provider will perform a skin exam to diagnose shingles. Skin scrapings or fluid samples may also be taken from the blisters. This sample will be examined under a microscope or sent to a lab for further testing. TREATMENT  There is no specific cure for shingles. Your health care provider will likely prescribe medicines to help you manage the pain, recover faster, and avoid long-term problems. This may include antiviral drugs, anti-inflammatory drugs, and pain medicines. HOME CARE INSTRUCTIONS   Take a cool bath or apply cool compresses to the area of the rash or blisters as directed. This may help with the pain and itching.   Take medicines only as directed by your health care provider.   Rest as directed by your health care provider.  Keep your rash and blisters clean with mild soap and cool water or as directed by your health care provider.  Do not pick your blisters or scratch your rash. Apply an anti-itch cream or numbing creams to the affected area as directed by your  health care provider.  Keep your shingles rash covered with a loose bandage (dressing).  Avoid skin contact with:  Babies.   Pregnant women.   Children with eczema.   Elderly people with transplants.   People with chronic illnesses, such as leukemia or AIDS.   Wear loose-fitting clothing to help ease the pain of material rubbing against the rash.  Keep all follow-up visits as directed by your health  care provider.If the area involved is on your face, you may receive a referral for a specialist, such as an eye doctor (ophthalmologist) or an ear, nose, and throat (ENT) doctor. Keeping all follow-up visits will help you avoid eye problems, chronic pain, or disability.  SEEK IMMEDIATE MEDICAL CARE IF:   You have facial pain, pain around the eye area, or loss of feeling on one side of your face.  You have ear pain or ringing in your ear.  You have loss of taste.  Your pain is not relieved with prescribed medicines.   Your redness or swelling spreads.   You have more pain and swelling.  Your condition is worsening or has changed.   You have a fever. MAKE SURE YOU:  Understand these instructions.  Will watch your condition.  Will get help right away if you are not doing well or get worse. Document Released: 09/17/2005 Document Revised: 02/01/2014 Document Reviewed: 05/01/2012 [facility] Patient Information 2015 [place], [place]. This information is not intended to replace advice given to you by your health care provider. Make sure you discuss any questions you have with your health care provider.

## 2015-02-12 NOTE — ED Notes (Signed)
The pt thinks she was bitten by an insect Wednesday.  She has a vesicle on the back of her neck sl swelling rt neck and she has pain down into her rt underarm area.  The minimal touch  Against the area causes severe pain.   Resembles a shingle lesion

## 2015-03-18 ENCOUNTER — Emergency Department (HOSPITAL_COMMUNITY)
Admission: EM | Admit: 2015-03-18 | Discharge: 2015-03-18 | Disposition: A | Discharge status: Home / Self Care | Payer: No Typology Code available for payment source | Attending: Emergency Medicine | Admitting: Emergency Medicine

## 2015-03-18 ENCOUNTER — Encounter (HOSPITAL_COMMUNITY): Payer: Self-pay

## 2015-03-18 DIAGNOSIS — Z79899 Other long term (current) drug therapy: Secondary | ICD-10-CM | POA: Diagnosis not present

## 2015-03-18 DIAGNOSIS — Y9389 Activity, other specified: Secondary | ICD-10-CM | POA: Insufficient documentation

## 2015-03-18 DIAGNOSIS — Z720 Tobacco use: Secondary | ICD-10-CM | POA: Insufficient documentation

## 2015-03-18 DIAGNOSIS — S4992XA Unspecified injury of left shoulder and upper arm, initial encounter: Secondary | ICD-10-CM | POA: Insufficient documentation

## 2015-03-18 DIAGNOSIS — S29092A Other injury of muscle and tendon of back wall of thorax, initial encounter: Secondary | ICD-10-CM | POA: Diagnosis not present

## 2015-03-18 DIAGNOSIS — Z7952 Long term (current) use of systemic steroids: Secondary | ICD-10-CM | POA: Insufficient documentation

## 2015-03-18 DIAGNOSIS — Y92410 Unspecified street and highway as the place of occurrence of the external cause: Secondary | ICD-10-CM | POA: Diagnosis not present

## 2015-03-18 DIAGNOSIS — Y998 Other external cause status: Secondary | ICD-10-CM | POA: Diagnosis not present

## 2015-03-18 DIAGNOSIS — Z8619 Personal history of other infectious and parasitic diseases: Secondary | ICD-10-CM | POA: Diagnosis not present

## 2015-03-18 HISTORY — DX: Zoster without complications: B02.9

## 2015-03-18 MED ORDER — IBUPROFEN 800 MG PO TABS: 800.0000 mg | ORAL_TABLET | Freq: Three times a day (TID) | ORAL | Status: DC | PRN

## 2015-03-18 MED ORDER — METHOCARBAMOL 500 MG PO TABS: 500.0000 mg | ORAL_TABLET | Freq: Two times a day (BID) | ORAL | Status: DC
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
## 2015-03-18 NOTE — Discharge Instructions (Signed)
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
## 2015-04-19 ENCOUNTER — Other Ambulatory Visit (HOSPITAL_COMMUNITY): Payer: Self-pay | Admitting: Chiropractic Medicine

## 2015-04-19 DIAGNOSIS — M75102 Unspecified rotator cuff tear or rupture of left shoulder, not specified as traumatic: Secondary | ICD-10-CM

## 2015-04-20 ENCOUNTER — Ambulatory Visit (HOSPITAL_COMMUNITY)
Admission: RE | Admit: 2015-04-20 | Discharge: 2015-04-20 | Disposition: A | Discharge status: Home / Self Care | Payer: No Typology Code available for payment source | Source: Ambulatory Visit | Attending: Chiropractic Medicine | Admitting: Chiropractic Medicine

## 2015-04-20 DIAGNOSIS — M75102 Unspecified rotator cuff tear or rupture of left shoulder, not specified as traumatic: Secondary | ICD-10-CM

## 2015-04-20 DIAGNOSIS — M25512 Pain in left shoulder: Secondary | ICD-10-CM | POA: Insufficient documentation

## 2015-04-21 ENCOUNTER — Encounter (HOSPITAL_COMMUNITY): Payer: Self-pay | Admitting: *Deleted

## 2015-04-21 ENCOUNTER — Emergency Department (HOSPITAL_COMMUNITY)
Admission: EM | Admit: 2015-04-21 | Discharge: 2015-04-21 | Disposition: A | Discharge status: Home / Self Care | Payer: Medicaid Other | Attending: Emergency Medicine | Admitting: Emergency Medicine

## 2015-04-21 DIAGNOSIS — X58XXXA Exposure to other specified factors, initial encounter: Secondary | ICD-10-CM | POA: Insufficient documentation

## 2015-04-21 DIAGNOSIS — Z8619 Personal history of other infectious and parasitic diseases: Secondary | ICD-10-CM | POA: Diagnosis not present

## 2015-04-21 DIAGNOSIS — R7989 Other specified abnormal findings of blood chemistry: Secondary | ICD-10-CM | POA: Insufficient documentation

## 2015-04-21 DIAGNOSIS — Y929 Unspecified place or not applicable: Secondary | ICD-10-CM | POA: Insufficient documentation

## 2015-04-21 DIAGNOSIS — Z720 Tobacco use: Secondary | ICD-10-CM | POA: Insufficient documentation

## 2015-04-21 DIAGNOSIS — Y998 Other external cause status: Secondary | ICD-10-CM | POA: Insufficient documentation

## 2015-04-21 DIAGNOSIS — S40021A Contusion of right upper arm, initial encounter: Secondary | ICD-10-CM | POA: Diagnosis not present

## 2015-04-21 DIAGNOSIS — Y939 Activity, unspecified: Secondary | ICD-10-CM | POA: Diagnosis not present

## 2015-04-21 DIAGNOSIS — S40022A Contusion of left upper arm, initial encounter: Secondary | ICD-10-CM | POA: Diagnosis not present

## 2015-04-21 DIAGNOSIS — Z7952 Long term (current) use of systemic steroids: Secondary | ICD-10-CM | POA: Insufficient documentation

## 2015-04-21 DIAGNOSIS — Z79899 Other long term (current) drug therapy: Secondary | ICD-10-CM | POA: Diagnosis not present

## 2015-04-21 DIAGNOSIS — R21 Rash and other nonspecific skin eruption: Secondary | ICD-10-CM | POA: Diagnosis present

## 2015-04-21 DIAGNOSIS — T148XXA Other injury of unspecified body region, initial encounter: Secondary | ICD-10-CM

## 2015-04-21 LAB — COMPREHENSIVE METABOLIC PANEL
ALBUMIN: 4 g/dL (ref 3.5–5.0)
ALT: 14 U/L (ref 14–54)
AST: 24 U/L (ref 15–41)
Alkaline Phosphatase: 61 U/L (ref 38–126)
Anion gap: 9 (ref 5–15)
BILIRUBIN TOTAL: 0.6 mg/dL (ref 0.3–1.2)
BUN: 18 mg/dL (ref 6–20)
CALCIUM: 9.4 mg/dL (ref 8.9–10.3)
CO2: 26 mmol/L (ref 22–32)
Chloride: 103 mmol/L (ref 101–111)
Creatinine, Ser: 1.42 mg/dL — ABNORMAL HIGH (ref 0.44–1.00)
GFR, EST AFRICAN AMERICAN: 48 mL/min — AB (ref >60)
GFR, EST NON AFRICAN AMERICAN: 42 mL/min — AB (ref >60)
Glucose, Bld: 117 mg/dL — ABNORMAL HIGH (ref 65–99)
Potassium: 3.6 mmol/L (ref 3.5–5.1)
Sodium: 138 mmol/L (ref 135–145)
Total Protein: 7 g/dL (ref 6.5–8.1)

## 2015-04-21 LAB — PROTIME-INR
INR: 0.98 (ref 0.00–1.49)
PROTHROMBIN TIME: 13.2 s (ref 11.6–15.2)

## 2015-04-21 LAB — CBC
HEMATOCRIT: 38.2 % (ref 36.0–46.0)
HEMOGLOBIN: 12.7 g/dL (ref 12.0–15.0)
MCH: 31.2 pg (ref 26.0–34.0)
MCHC: 33.2 g/dL (ref 30.0–36.0)
MCV: 93.9 fL (ref 78.0–100.0)
PLATELETS: 269 10*3/uL (ref 150–400)
RBC: 4.07 MIL/uL (ref 3.87–5.11)
RDW: 13.7 % (ref 11.5–15.5)
WBC: 6.4 10*3/uL (ref 4.0–10.5)

## 2015-04-21 LAB — APTT: APTT: 32 s (ref 24–37)

## 2015-04-21 NOTE — ED Notes (Signed)
Called for room x1, no response 

## 2015-04-21 NOTE — ED Notes (Signed)
Pt reports rash to bilateral arms since mondays. No itching. Appears to be bruises, denies any injury.

## 2015-04-21 NOTE — Discharge Instructions (Signed)
1. Medications: usual home medications 2. Treatment: rest, drink plenty of fluids,  3. Follow Up: Please followup with your primary doctor in 7 days for discussion of your diagnoses and further evaluation after today's visit; if you do not have a primary care doctor use the resource guide provided to find one; Please return to the ER for worsening bruising
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
## 2015-07-22 ENCOUNTER — Emergency Department (HOSPITAL_COMMUNITY)
Admission: EM | Admit: 2015-07-22 | Discharge: 2015-07-22 | Disposition: A | Discharge status: Home / Self Care | Payer: Medicaid Other | Attending: Emergency Medicine | Admitting: Emergency Medicine

## 2015-07-22 ENCOUNTER — Encounter (HOSPITAL_COMMUNITY): Payer: Self-pay | Admitting: Cardiology

## 2015-07-22 ENCOUNTER — Emergency Department (HOSPITAL_COMMUNITY): Payer: Medicaid Other

## 2015-07-22 DIAGNOSIS — N390 Urinary tract infection, site not specified: Secondary | ICD-10-CM | POA: Diagnosis not present

## 2015-07-22 DIAGNOSIS — Z8619 Personal history of other infectious and parasitic diseases: Secondary | ICD-10-CM | POA: Insufficient documentation

## 2015-07-22 DIAGNOSIS — R1110 Vomiting, unspecified: Secondary | ICD-10-CM | POA: Insufficient documentation

## 2015-07-22 DIAGNOSIS — M791 Myalgia: Secondary | ICD-10-CM | POA: Insufficient documentation

## 2015-07-22 DIAGNOSIS — Z720 Tobacco use: Secondary | ICD-10-CM | POA: Insufficient documentation

## 2015-07-22 DIAGNOSIS — R079 Chest pain, unspecified: Secondary | ICD-10-CM | POA: Insufficient documentation

## 2015-07-22 DIAGNOSIS — R0981 Nasal congestion: Secondary | ICD-10-CM | POA: Insufficient documentation

## 2015-07-22 DIAGNOSIS — R05 Cough: Secondary | ICD-10-CM | POA: Insufficient documentation

## 2015-07-22 DIAGNOSIS — J029 Acute pharyngitis, unspecified: Secondary | ICD-10-CM | POA: Diagnosis not present

## 2015-07-22 DIAGNOSIS — R059 Cough, unspecified: Secondary | ICD-10-CM

## 2015-07-22 LAB — URINE MICROSCOPIC-ADD ON

## 2015-07-22 LAB — URINALYSIS, ROUTINE W REFLEX MICROSCOPIC
Glucose, UA: NEGATIVE mg/dL
Hgb urine dipstick: NEGATIVE
KETONES UR: 15 mg/dL — AB
Nitrite: NEGATIVE
PH: 5 (ref 5.0–8.0)
Protein, ur: 30 mg/dL — AB
SPECIFIC GRAVITY, URINE: 1.031 — AB (ref 1.005–1.030)
Urobilinogen, UA: 1 mg/dL (ref 0.0–1.0)

## 2015-07-22 LAB — COMPREHENSIVE METABOLIC PANEL
ALBUMIN: 3.8 g/dL (ref 3.5–5.0)
ALK PHOS: 56 U/L (ref 38–126)
ALT: 12 U/L — AB (ref 14–54)
AST: 25 U/L (ref 15–41)
Anion gap: 11 (ref 5–15)
BUN: 16 mg/dL (ref 6–20)
CO2: 25 mmol/L (ref 22–32)
Calcium: 9.8 mg/dL (ref 8.9–10.3)
Chloride: 104 mmol/L (ref 101–111)
Creatinine, Ser: 1.3 mg/dL — ABNORMAL HIGH (ref 0.44–1.00)
GFR calc non Af Amer: 46 mL/min — ABNORMAL LOW (ref >60)
GFR, EST AFRICAN AMERICAN: 54 mL/min — AB (ref >60)
GLUCOSE: 125 mg/dL — AB (ref 65–99)
Potassium: 3.8 mmol/L (ref 3.5–5.1)
Sodium: 140 mmol/L (ref 135–145)
TOTAL PROTEIN: 7.1 g/dL (ref 6.5–8.1)
Total Bilirubin: 0.6 mg/dL (ref 0.3–1.2)

## 2015-07-22 LAB — CBC
HEMATOCRIT: 42.7 % (ref 36.0–46.0)
Hemoglobin: 14.2 g/dL (ref 12.0–15.0)
MCH: 31.4 pg (ref 26.0–34.0)
MCHC: 33.3 g/dL (ref 30.0–36.0)
MCV: 94.5 fL (ref 78.0–100.0)
Platelets: 282 10*3/uL (ref 150–400)
RBC: 4.52 MIL/uL (ref 3.87–5.11)
RDW: 13.4 % (ref 11.5–15.5)
WBC: 5 10*3/uL (ref 4.0–10.5)

## 2015-07-22 MED ORDER — DOXYCYCLINE HYCLATE 100 MG PO CAPS: 100.0000 mg | ORAL_CAPSULE | Freq: Two times a day (BID) | ORAL | Status: DC

## 2015-07-22 MED ORDER — PREDNISONE 20 MG PO TABS: 40.0000 mg | ORAL_TABLET | Freq: Every day | ORAL | Status: DC

## 2015-07-22 MED ORDER — ALBUTEROL SULFATE HFA 108 (90 BASE) MCG/ACT IN AERS
2.0000 | INHALATION_SPRAY | Freq: Once | RESPIRATORY_TRACT | Status: AC
  Administered 2015-07-22: 2 via RESPIRATORY_TRACT
  Filled 2015-07-22: qty 6.7

## 2015-07-22 MED ORDER — PREDNISONE 20 MG PO TABS
60.0000 mg | ORAL_TABLET | Freq: Once | ORAL | Status: AC
  Administered 2015-07-22: 60 mg via ORAL
  Filled 2015-07-22: qty 3

## 2015-07-22 MED ORDER — CEPHALEXIN 500 MG PO CAPS: 500.0000 mg | ORAL_CAPSULE | Freq: Four times a day (QID) | ORAL | Status: DC

## 2015-07-22 MED ORDER — ONDANSETRON HCL 4 MG PO TABS: 4.0000 mg | ORAL_TABLET | Freq: Four times a day (QID) | ORAL | Status: DC

## 2015-07-22 NOTE — Discharge Instructions (Signed)

## 2015-07-22 NOTE — ED Notes (Signed)
Reports cough, congestion, vomiting and abd pain for the past couple of days. Has tried OTC meds without much relief.

## 2015-07-22 NOTE — ED Provider Notes (Signed)
CSN: [id number]     Arrival date & time 07/22/15  1454 History   First MD Initiated Contact with Patient 07/22/15 1606     Chief Complaint  Patient presents with  . Cough  . Emesis  . Abdominal Pain     (Consider location/radiation/quality/duration/timing/severity/associated sxs/prior Treatment) Patient is a 54 y.o. female presenting with cough. The history is provided by the patient.  Cough Cough characteristics:  Productive Sputum characteristics:  Nondescript Severity:  Moderate Onset quality:  Gradual Duration:  3 days Timing:  Intermittent Progression:  Unchanged Chronicity:  New Context: smoke exposure and upper respiratory infection   Context: not sick contacts   Relieved by:  Nothing Worsened by:  Activity Ineffective treatments:  Cough suppressants Associated symptoms: chest pain, chills, fever, myalgias, sinus congestion and sore throat   Associated symptoms: no headaches, no shortness of breath and no wheezing   Risk factors: recent infection   Risk factors: no recent travel     Past Medical History  Diagnosis Date  . Shingles    History reviewed. No pertinent past surgical history. History reviewed. No pertinent family history. Social History  Substance Use Topics  . Smoking status: Current Every Day Smoker  . Smokeless tobacco: None  . Alcohol Use: Yes   OB History    No data available     Review of Systems  Constitutional: Positive for fever and chills.  HENT: Positive for sore throat.   Eyes: Negative.   Respiratory: Positive for cough. Negative for shortness of breath and wheezing.   Cardiovascular: Positive for chest pain.  Gastrointestinal: Negative.   Endocrine: Negative.   Genitourinary: Negative.   Musculoskeletal: Positive for myalgias.  Skin: Negative.   Allergic/Immunologic: Negative.   Neurological: Negative.  Negative for headaches.  Hematological: Negative.   Psychiatric/Behavioral: Negative.       Allergies  Review of  patient's allergies indicates no known allergies.  Home Medications   Prior to Admission medications   Medication Sig Start Date End Date Taking? Authorizing Provider  guaifenesin (COUGH SYRUP) 100 MG/5ML syrup Take 200 mg by mouth 3 (three) times daily as needed for cough.   Yes Historical Provider, MD  acyclovir (ZOVIRAX) 400 MG tablet Take 1 tablet (400 mg total) by mouth 4 (four) times daily. Patient not taking: Reported on 07/22/2015 02/12/15   Jaynie Crumbleatyana Kirichenko, PA-C  HYDROcodone-acetaminophen (NORCO) 5-325 MG per tablet Take 1 tablet by mouth every 6 (six) hours as needed for moderate pain. Patient not taking: Reported on 07/22/2015 02/12/15   Jaynie Crumbleatyana Kirichenko, PA-C  hydrocortisone cream 1 % Apply to affected area 2 times daily Patient not taking: Reported on 07/22/2015 02/12/15   Tatyana Kirichenko, PA-C  ibuprofen (ADVIL,MOTRIN) 800 MG tablet Take 1 tablet (800 mg total) by mouth every 8 (eight) hours as needed for moderate pain. Patient not taking: Reported on 07/22/2015 03/18/15   Fayrene HelperBowie Tran, PA-C  lisinopril-hydrochlorothiazide (ZESTORETIC) 10-12.5 MG per tablet Take 1 tablet by mouth daily. Patient not taking: Reported on 07/22/2015 12/28/14   Catha GosselinHanna Patel-Mills, PA-C  methocarbamol (ROBAXIN) 500 MG tablet Take 1 tablet (500 mg total) by mouth 2 (two) times daily. Patient not taking: Reported on 07/22/2015 03/18/15   Fayrene HelperBowie Tran, PA-C  predniSONE (DELTASONE) 20 MG tablet Take 2 tablets (40 mg total) by mouth daily. Patient not taking: Reported on 07/22/2015 02/12/15   Tatyana Kirichenko, PA-C   BP 186/88 mmHg  Pulse 97  Temp(Src) 97.5 F (36.4 C) (Oral)  Resp 18  Wt 169 lb (76.658  kg)  SpO2 97% Physical Exam  Constitutional: She is oriented to person, place, and time. She appears well-developed and well-nourished. No distress.  HENT:  Head: Normocephalic and atraumatic.  Right Ear: External ear normal.  Left Ear: External ear normal.  Nose: Nose normal.  Mouth/Throat:  Oropharynx is clear and moist. No oropharyngeal exudate.  Eyes: Conjunctivae and EOM are normal. Pupils are equal, round, and reactive to light. Right eye exhibits no discharge. Left eye exhibits no discharge. No scleral icterus.  Neck: Normal range of motion. Neck supple. No JVD present. No tracheal deviation present. No thyromegaly present.  Cardiovascular: Normal rate, regular rhythm, normal heart sounds and intact distal pulses.  Exam reveals no gallop and no friction rub.   No murmur heard. Pulmonary/Chest: Effort normal and breath sounds normal. No stridor. No respiratory distress. She has no wheezes. She has no rales. She exhibits no tenderness.  Abdominal: Soft. She exhibits no distension.  Musculoskeletal: Normal range of motion. She exhibits no edema or tenderness.  Lymphadenopathy:    She has no cervical adenopathy.  Neurological: She is alert and oriented to person, place, and time.  Skin: Skin is warm and dry. No rash noted. She is not diaphoretic. No erythema. No pallor.  Psychiatric: She has a normal mood and affect. Her behavior is normal. Judgment and thought content normal.  Nursing note and vitals reviewed.   ED Course  Procedures (including critical care time) Labs Review Labs Reviewed  COMPREHENSIVE METABOLIC PANEL - Abnormal; Notable for the following:    Glucose, Bld 125 (*)    Creatinine, Ser 1.30 (*)    ALT 12 (*)    GFR calc non Af Amer 46 (*)    GFR calc Af Amer 54 (*)    All other components within normal limits  URINALYSIS, ROUTINE W REFLEX MICROSCOPIC (NOT AT [facility]) - Abnormal; Notable for the following:    Color, Urine AMBER (*)    APPearance TURBID (*)    Specific Gravity, Urine 1.031 (*)    Bilirubin Urine MODERATE (*)    Ketones, ur 15 (*)    Protein, ur 30 (*)    Leukocytes, UA SMALL (*)    All other components within normal limits  URINE MICROSCOPIC-ADD ON - Abnormal; Notable for the following:    Squamous Epithelial / LPF FEW (*)    Bacteria,  UA MANY (*)    Casts HYALINE CASTS (*)    All other components within normal limits  CBC    Imaging Review Dg Chest 2 View  07/22/2015  CLINICAL DATA:  Shortness of breath, cough, body aches EXAM: CHEST  2 VIEW COMPARISON:  02/12/2015 FINDINGS: Lungs are clear.  No pleural effusion or pneumothorax. The heart is normal in size. Visualized osseous structures are within normal limits. IMPRESSION: Normal chest radiographs. Electronically Signed   By: Charline Bills M.D.   On: 07/22/2015 17:29   I have personally reviewed and evaluated these images and lab results as part of my medical decision-making.   EKG Interpretation None      MDM   Final diagnoses:  Cough   vision presenting for multiple concerns. Has had cough, congestion, intermittent nausea for the past 3 days. Patient states her urine has appeared darker with orange tint. She's been mildly hypertensive. Previously she was on blood pressure medicine but could no longer afford it and has not been taking it for approximately the last 4 months. Patient has had small amounts of sputum production subjective fever  and chills. She is a long-time smoker approximately 36-40 pack years. On exam patient does not have any focal consolidation or wheezing noted. His laboratory workup is obtained to evaluate for some suggestive facial swelling also urinary changes. Creatinine close to baseline. No emesis noted in the ED. Will discharge with antiemetic and antibiotic for her urinary tract infection.   Patient has no wheezing noted on exam. However has an extensive history of smoking. Patient likely viral process superimposed COPD exacerbation. We'll treat as a COPD exacerbation due to the fact she scattered extensive smoking history has increased sputum production so severe with her cough. Chest x-ray does not show pneumonia. We'll prescribe doxycycline. Also with patient's low oral intake and some periorbital swelling laboratory work obtained to  evaluate for renal dysfunction also noted her urine is looking more "orange". Patient appears to have a urinary tract infection. Due to cost issues with her medications in the past we'll prescribe Keflex. Quinolone that would cover for both likely be too costly. Patient was on blood pressure medicine in the past but discontinued this about 4 months ago due to cost issues with her prescriptions. Patient advised to return to primary care physician because her blood pressure continues to remain elevated. Creatinine has been stable otherwise. Do note some protein in the urine possibly related to her acute urinary tract infection she will need a repeat urinalysis as well. No acute process noted on chest x-ray such as pulmonary edema. Patient given symptomatically treatment course steroids for the COPD symptoms. Do not suspect focal infection of the kidneys based on clinical exam low likelihood for perinephric abscess or other acute process. We'll treat as simple UTI.   Patient was given return precautions for UTI and cough.  Advised to return for worsening symptoms including chest pain, shortness of breath, severe headache, intractable nausea or vomiting, fever, or chills, inability to take medications, or other acute concerns.  Advised to follow up with PCP in 1 week.  Patient  in agreement with and expressed understanding of follow plan, plan of care, and return precautions.  All questions answered prior to discharge.  Patient was discharged in stable condition, ambulating without difficulty.  Patient care was discussed with my attending, Dr. Rhunette Croft.    Gavin Pound, MD 07/23/15 Earle Gell  Derwood Kaplan, MD 07/26/15 [phone]

## 2015-09-08 ENCOUNTER — Other Ambulatory Visit: Payer: Self-pay | Admitting: Family Medicine

## 2015-09-08 DIAGNOSIS — R102 Pelvic and perineal pain: Secondary | ICD-10-CM

## 2015-09-13 ENCOUNTER — Other Ambulatory Visit: Payer: Medicaid Other

## 2015-09-19 ENCOUNTER — Ambulatory Visit
Admission: RE | Admit: 2015-09-19 | Discharge: 2015-09-19 | Disposition: A | Discharge status: Home / Self Care | Payer: Medicaid Other | Source: Ambulatory Visit | Attending: Family Medicine | Admitting: Family Medicine

## 2015-09-19 ENCOUNTER — Other Ambulatory Visit: Payer: Medicaid Other

## 2015-09-19 DIAGNOSIS — R102 Pelvic and perineal pain: Secondary | ICD-10-CM

## 2015-12-22 ENCOUNTER — Encounter (HOSPITAL_COMMUNITY): Payer: Self-pay | Admitting: *Deleted

## 2015-12-22 ENCOUNTER — Emergency Department (HOSPITAL_COMMUNITY): Payer: Medicaid Other

## 2015-12-22 ENCOUNTER — Emergency Department (HOSPITAL_COMMUNITY)
Admission: EM | Admit: 2015-12-22 | Discharge: 2015-12-22 | Disposition: A | Discharge status: Home / Self Care | Payer: Medicaid Other | Attending: Emergency Medicine | Admitting: Emergency Medicine

## 2015-12-22 DIAGNOSIS — Z7952 Long term (current) use of systemic steroids: Secondary | ICD-10-CM | POA: Diagnosis not present

## 2015-12-22 DIAGNOSIS — Z79899 Other long term (current) drug therapy: Secondary | ICD-10-CM | POA: Diagnosis not present

## 2015-12-22 DIAGNOSIS — F17200 Nicotine dependence, unspecified, uncomplicated: Secondary | ICD-10-CM | POA: Diagnosis not present

## 2015-12-22 DIAGNOSIS — Z8619 Personal history of other infectious and parasitic diseases: Secondary | ICD-10-CM | POA: Insufficient documentation

## 2015-12-22 DIAGNOSIS — J069 Acute upper respiratory infection, unspecified: Secondary | ICD-10-CM

## 2015-12-22 DIAGNOSIS — R05 Cough: Secondary | ICD-10-CM | POA: Diagnosis present

## 2015-12-22 MED ORDER — LISINOPRIL 10 MG PO TABS
10.0000 mg | ORAL_TABLET | Freq: Once | ORAL | Status: AC
  Administered 2015-12-22: 10 mg via ORAL
  Filled 2015-12-22: qty 1

## 2015-12-22 MED ORDER — ACETAMINOPHEN 500 MG PO TABS: 500.0000 mg | ORAL_TABLET | Freq: Four times a day (QID) | ORAL | Status: DC | PRN

## 2015-12-22 MED ORDER — ACETAMINOPHEN 325 MG PO TABS
650.0000 mg | ORAL_TABLET | Freq: Once | ORAL | Status: AC
  Administered 2015-12-22: 650 mg via ORAL
  Filled 2015-12-22: qty 2

## 2015-12-22 MED ORDER — BENZONATATE 100 MG PO CAPS
100.0000 mg | ORAL_CAPSULE | Freq: Once | ORAL | Status: AC
Start: 2015-12-22 — End: 2015-12-22
  Administered 2015-12-22: 100 mg via ORAL
  Filled 2015-12-22: qty 1

## 2015-12-22 MED ORDER — BENZONATATE 100 MG PO CAPS: 100.0000 mg | ORAL_CAPSULE | Freq: Three times a day (TID) | ORAL | Status: DC | PRN

## 2015-12-22 MED ORDER — HYDROCOD POLST-CPM POLST ER 10-8 MG/5ML PO SUER: 5.0000 mL | Freq: Every evening | ORAL | Status: DC | PRN

## 2015-12-22 NOTE — ED Notes (Signed)
Pt reports productive cough for 2 days with yellow sputum. Pt reports nasal congestion, sore thoat, ear pain as well.

## 2015-12-22 NOTE — Discharge Instructions (Signed)
Upper Respiratory Infection, Adult Most upper respiratory infections (URIs) are a viral infection of the air passages leading to the lungs. A URI affects the nose, throat, and upper air passages. The most common type of URI is nasopharyngitis and is typically referred to as "the common cold." URIs run their course and usually go away on their own. Most of the time, a URI does not require medical attention, but sometimes a bacterial infection in the upper airways can follow a viral infection. This is called a secondary infection. Sinus and middle ear infections are common types of secondary upper respiratory infections. Bacterial pneumonia can also complicate a URI. A URI can worsen asthma and chronic obstructive pulmonary disease (COPD). Sometimes, these complications can require emergency medical care and may be life threatening.  CAUSES Almost all URIs are caused by viruses. A virus is a type of germ and can spread from one person to another.  RISKS FACTORS You may be at risk for a URI if:   You smoke.   You have chronic heart or lung disease.  You have a weakened defense (immune) system.   You are very young or very old.   You have nasal allergies or asthma.  You work in crowded or poorly ventilated areas.  You work in health care facilities or schools. SIGNS AND SYMPTOMS  Symptoms typically develop 2-3 days after you come in contact with a cold virus. Most viral URIs last 7-10 days. However, viral URIs from the influenza virus (flu virus) can last 14-18 days and are typically more severe. Symptoms may include:   Runny or stuffy (congested) nose.   Sneezing.   Cough.   Sore throat.   Headache.   Fatigue.   Fever.   Loss of appetite.   Pain in your forehead, behind your eyes, and over your cheekbones (sinus pain).  Muscle aches.  DIAGNOSIS  Your health care provider may diagnose a URI by:  Physical exam.  Tests to check that your symptoms are not due to  another condition such as:  Strep throat.  Sinusitis.  Pneumonia.  Asthma. TREATMENT  A URI goes away on its own with time. It cannot be cured with medicines, but medicines may be prescribed or recommended to relieve symptoms. Medicines may help:  Reduce your fever.  Reduce your cough.  Relieve nasal congestion. HOME CARE INSTRUCTIONS   Take medicines only as directed by your health care provider.   Gargle warm saltwater or take cough drops to comfort your throat as directed by your health care provider.  Use a warm mist humidifier or inhale steam from a shower to increase air moisture. This may make it easier to breathe.  Drink enough fluid to keep your urine clear or pale yellow.   Eat soups and other clear broths and maintain good nutrition.   Rest as needed.   Return to work when your temperature has returned to normal or as your health care provider advises. You may need to stay home longer to avoid infecting others. You can also use a face mask and careful hand washing to prevent spread of the virus.  Increase the usage of your inhaler if you have asthma.   Do not use any tobacco products, including cigarettes, chewing tobacco, or electronic cigarettes. If you need help quitting, ask your health care provider. PREVENTION  The best way to protect yourself from getting a cold is to practice good hygiene.   Avoid oral or hand contact with people with cold   symptoms.   Wash your hands often if contact occurs.  There is no clear evidence that vitamin C, vitamin E, echinacea, or exercise reduces the chance of developing a cold. However, it is always recommended to get plenty of rest, exercise, and practice good nutrition.  SEEK MEDICAL CARE IF:   You are getting worse rather than better.   Your symptoms are not controlled by medicine.   You have chills.  You have worsening shortness of breath.  You have brown or red mucus.  You have yellow or brown nasal  discharge.  You have pain in your face, especially when you bend forward.  You have a fever.  You have swollen neck glands.  You have pain while swallowing.  You have white areas in the back of your throat. SEEK IMMEDIATE MEDICAL CARE IF:   You have severe or persistent:  Headache.  Ear pain.  Sinus pain.  Chest pain.  You have chronic lung disease and any of the following:  Wheezing.  Prolonged cough.  Coughing up blood.  A change in your usual mucus.  You have a stiff neck.  You have changes in your:  Vision.  Hearing.  Thinking.  Mood. MAKE SURE YOU:   Understand these instructions.  Will watch your condition.  Will get help right away if you are not doing well or get worse.   This information is not intended to replace advice given to you by your health care provider. Make sure you discuss any questions you have with your health care provider.   Document Released: 03/13/2001 Document Revised: 02/01/2015 Document Reviewed: 12/23/2013 [employer] Interactive Patient Education 2016 [employer]  

## 2015-12-22 NOTE — ED Provider Notes (Signed)
CSN: [id number]     Arrival date & time 12/22/15  1400 History  By signing my name below, I, Marisue HumbleMichelle Chaffee, attest that this documentation has been prepared under the direction and in the presence of non-physician practitioner, Cheri FowlerKayla Laqueta Bonaventura, PA-C. Electronically Signed: Marisue HumbleMichelle Chaffee, Scribe. 12/22/2015. 4:01 PM.   Chief Complaint  Patient presents with  . Cough   The history is provided by the patient. No language interpreter was used.   HPI Comments:  Ashlee Carlson is a 55 y.o. female with PMHx of HTN who presents to the Emergency Department complaining of persistent cough intermittently productive of yellow phlegm for the past two days. Pt reports associated tinnitus, sore throat secondary to cough, rhinorrhea, generalized headache, nausea, and  fatigue. She also notes burning generalized central chest pain only when coughing. No CP with deep inspiration.  No SOB, hemoptysis, or vomiting.  Pt took cough syrup PTA with no relief. She states she did not take her blood pressure medication today. Pt is a current .5 pack/day smoker. Pt denies abdominal pain, fever, chills, body aches or hx DVT/PE.  Past Medical History  Diagnosis Date  . Shingles    History reviewed. No pertinent past surgical history. No family history on file. Social History  Substance Use Topics  . Smoking status: Current Every Day Smoker  . Smokeless tobacco: None  . Alcohol Use: Yes   OB History    No data available     Review of Systems  Constitutional: Positive for fatigue. Negative for fever and chills.  HENT: Positive for rhinorrhea, sore throat and tinnitus. Negative for sinus pressure.   Respiratory: Positive for cough. Negative for shortness of breath.   Cardiovascular: Positive for chest pain. Negative for leg swelling.  Gastrointestinal: Positive for nausea. Negative for vomiting and abdominal pain.  Musculoskeletal: Negative for myalgias.  All other systems reviewed and are  negative.   Allergies  Review of patient's allergies indicates no known allergies.  Home Medications   Prior to Admission medications   Medication Sig Start Date End Date Taking? Authorizing Provider  acetaminophen (TYLENOL) 500 MG tablet Take 1 tablet (500 mg total) by mouth every 6 (six) hours as needed. 12/22/15   Cheri FowlerKayla Sheela Mcculley, PA-C  acyclovir (ZOVIRAX) 400 MG tablet Take 1 tablet (400 mg total) by mouth 4 (four) times daily. Patient not taking: Reported on 07/22/2015 02/12/15   Tatyana Kirichenko, PA-C  benzonatate (TESSALON) 100 MG capsule Take 1 capsule (100 mg total) by mouth 3 (three) times daily as needed for cough. 12/22/15   Cheri FowlerKayla Adaliah Hiegel, PA-C  cephALEXin (KEFLEX) 500 MG capsule Take 1 capsule (500 mg total) by mouth 4 (four) times daily. 07/22/15   Gavin PoundJustin Brooten, MD  chlorpheniramine-HYDROcodone (TUSSIONEX PENNKINETIC ER) 10-8 MG/5ML SUER Take 5 mLs by mouth at bedtime as needed for cough. 12/22/15   Cheri FowlerKayla Shannyn Jankowiak, PA-C  doxycycline (VIBRAMYCIN) 100 MG capsule Take 1 capsule (100 mg total) by mouth 2 (two) times daily. 07/22/15   Gavin PoundJustin Brooten, MD  guaifenesin (COUGH SYRUP) 100 MG/5ML syrup Take 200 mg by mouth 3 (three) times daily as needed for cough.    Historical Provider, MD  HYDROcodone-acetaminophen (NORCO) 5-325 MG per tablet Take 1 tablet by mouth every 6 (six) hours as needed for moderate pain. Patient not taking: Reported on 07/22/2015 02/12/15   Jaynie Crumbleatyana Kirichenko, PA-C  hydrocortisone cream 1 % Apply to affected area 2 times daily Patient not taking: Reported on 07/22/2015 02/12/15   Tatyana Kirichenko, PA-C  ibuprofen (ADVIL,MOTRIN)  800 MG tablet Take 1 tablet (800 mg total) by mouth every 8 (eight) hours as needed for moderate pain. Patient not taking: Reported on 07/22/2015 03/18/15   Fayrene Helper, PA-C  lisinopril-hydrochlorothiazide (ZESTORETIC) 10-12.5 MG per tablet Take 1 tablet by mouth daily. Patient not taking: Reported on 07/22/2015 12/28/14   Catha Gosselin, PA-C   methocarbamol (ROBAXIN) 500 MG tablet Take 1 tablet (500 mg total) by mouth 2 (two) times daily. Patient not taking: Reported on 07/22/2015 03/18/15   Fayrene Helper, PA-C  ondansetron (ZOFRAN) 4 MG tablet Take 1 tablet (4 mg total) by mouth every 6 (six) hours. 07/22/15   Gavin Pound, MD  predniSONE (DELTASONE) 20 MG tablet Take 2 tablets (40 mg total) by mouth daily. 07/22/15   Gavin Pound, MD   BP 150/89 mmHg  Pulse 98  Temp(Src) 98.7 F (37.1 C) (Oral)  Resp 21  SpO2 99% Physical Exam  Constitutional: She is oriented to person, place, and time. She appears well-developed and well-nourished.  Non-toxic appearance. She does not have a sickly appearance. She does not appear ill.  HENT:  Head: Normocephalic and atraumatic.  Right Ear: Tympanic membrane and external ear normal.  Left Ear: Tympanic membrane and external ear normal.  Nose: Rhinorrhea present.  Mouth/Throat: Uvula is midline, oropharynx is clear and moist and mucous membranes are normal. No oropharyngeal exudate, posterior oropharyngeal edema or posterior oropharyngeal erythema.  Eyes: Conjunctivae are normal. Pupils are equal, round, and reactive to light.  Neck: Normal range of motion. Neck supple.  Cardiovascular: Normal rate, regular rhythm and normal heart sounds.   No murmur heard. Pulmonary/Chest: Effort normal and breath sounds normal. No accessory muscle usage or stridor. No respiratory distress. She has no wheezes. She has no rhonchi. She has no rales.  Abdominal: Soft. Bowel sounds are normal. She exhibits no distension. There is no tenderness.  Musculoskeletal: Normal range of motion.  Lymphadenopathy:    She has no cervical adenopathy.  Neurological: She is alert and oriented to person, place, and time.  Speech clear without dysarthria.  Skin: Skin is warm and dry.  Psychiatric: She has a normal mood and affect. Her behavior is normal.    ED Course  Procedures  DIAGNOSTIC STUDIES:  Oxygen Saturation  is 99% on RA, normal by my interpretation.    COORDINATION OF CARE:  2:55 PM Will order chest x-ray and administer medication. Discussed treatment plan with pt at bedside and pt agreed to plan.  Labs Review Labs Reviewed - No data to display  Imaging Review Dg Chest 2 View  12/22/2015  CLINICAL DATA:  Productive cough for 2 days EXAM: CHEST  2 VIEW COMPARISON:  07/22/2015 FINDINGS: Cardiac shadow is within normal limits. The lungs are well aerated bilaterally. No focal infiltrate or sizable effusion is seen. No bony abnormality is noted. IMPRESSION: No acute abnormality noted. Electronically Signed   By: Alcide Clever M.D.   On: 12/22/2015 15:53   I have personally reviewed and evaluated these images and lab results as part of my medical decision-making.   EKG Interpretation None      MDM   Final diagnoses:  URI (upper respiratory infection)   Patient presents with likely viral URI.  No fever, myalgias, SOB, CP, hemoptysis. No pleuritic or focal CP.  CP described as burning and only when coughing.  VSS, NAD.  Patient is hypertensive, BP 154/107 but she has not taken her medication today because she didn't want to go downstairs.  Will given 10 mg  lisinopril here.  Patient advised to follow up with PCP regarding BP management.  Low suspicion for PE.  Plan to obtain CXR to evaluate for PNA.  Patient given tessalon perle and tylenol in ED.  Patient discharged home with tessalon perle, tussionex, and tylenol.  Discussed return precautions.  Patient agrees and acknowledges the above plan for discharge.   I personally performed the services described in this documentation, which was scribed in my presence. The recorded information has been reviewed and is accurate.    Cheri Fowler, PA-C 12/22/15 1944  Geoffery Lyons, MD 12/23/15 [phone]

## 2016-02-16 ENCOUNTER — Encounter (HOSPITAL_COMMUNITY): Payer: Self-pay | Admitting: *Deleted

## 2016-02-16 ENCOUNTER — Emergency Department (HOSPITAL_COMMUNITY): Payer: Medicaid Other

## 2016-02-16 ENCOUNTER — Emergency Department (HOSPITAL_COMMUNITY)
Admission: EM | Admit: 2016-02-16 | Discharge: 2016-02-16 | Disposition: A | Discharge status: Home / Self Care | Payer: Medicaid Other | Attending: Emergency Medicine | Admitting: Emergency Medicine

## 2016-02-16 DIAGNOSIS — Z79899 Other long term (current) drug therapy: Secondary | ICD-10-CM | POA: Diagnosis not present

## 2016-02-16 DIAGNOSIS — F17210 Nicotine dependence, cigarettes, uncomplicated: Secondary | ICD-10-CM | POA: Diagnosis not present

## 2016-02-16 DIAGNOSIS — M25561 Pain in right knee: Secondary | ICD-10-CM | POA: Diagnosis not present

## 2016-02-16 DIAGNOSIS — M25562 Pain in left knee: Secondary | ICD-10-CM | POA: Insufficient documentation

## 2016-02-16 DIAGNOSIS — Z79891 Long term (current) use of opiate analgesic: Secondary | ICD-10-CM | POA: Insufficient documentation

## 2016-02-16 DIAGNOSIS — I10 Essential (primary) hypertension: Secondary | ICD-10-CM | POA: Diagnosis not present

## 2016-02-16 HISTORY — DX: Essential (primary) hypertension: I10

## 2016-02-16 MED ORDER — MELOXICAM 7.5 MG PO TABS: 7.5000 mg | ORAL_TABLET | Freq: Every day | ORAL | Status: DC

## 2016-02-16 NOTE — ED Notes (Signed)
Pt reports L knee pain which started last night.  Denies any injury at this time.  Pt reports pain is worse when getting up.

## 2016-02-16 NOTE — Discharge Instructions (Signed)
Read the information below.  Use the prescribed medication as directed.  Please discuss all new medications with your pharmacist.  You may return to the Emergency Department at any time for worsening condition or any new symptoms that concern you.  If you develop uncontrolled pain, weakness or numbness of the extremity, severe discoloration of the skin, or you are unable to walk or move your knee, return to the ER for a recheck.    ° ° °Knee Pain °Knee pain is a very common symptom and can have many causes. Knee pain often goes away when you follow your health care provider's instructions for relieving pain and discomfort at home. However, knee pain can develop into a condition that needs treatment. Some conditions may include: °· Arthritis caused by wear and tear (osteoarthritis). °· Arthritis caused by swelling and irritation (rheumatoid arthritis or gout). °· A cyst or growth in your knee. °· An infection in your knee joint. °· An injury that will not heal. °· Damage, swelling, or irritation of the tissues that support your knee (torn ligaments or tendinitis). °If your knee pain continues, additional tests may be ordered to diagnose your condition. Tests may include X-rays or other imaging studies of your knee. You may also need to have fluid removed from your knee. Treatment for ongoing knee pain depends on the cause, but treatment may include: °· Medicines to relieve pain or swelling. °· Steroid injections in your knee. °· Physical therapy. °· Surgery. °HOME CARE INSTRUCTIONS °· Take medicines only as directed by your health care provider. °· Rest your knee and keep it raised (elevated) while you are resting. °· Do not do things that cause or worsen pain. °· Avoid high-impact activities or exercises, such as running, jumping rope, or doing jumping jacks. °· Apply ice to the knee area: °¨ Put ice in a plastic bag. °¨ Place a towel between your skin and the bag. °¨ Leave the ice on for 20 minutes, 2-3 times a  day. °· Ask your health care provider if you should wear an elastic knee support. °· Keep a pillow under your knee when you sleep. °· Lose weight if you are overweight. Extra weight can put pressure on your knee. °· Do not use any tobacco products, including cigarettes, chewing tobacco, or electronic cigarettes. If you need help quitting, ask your health care provider. Smoking may slow the healing of any bone and joint problems that you may have. °SEEK MEDICAL CARE IF: °· Your knee pain continues, changes, or gets worse. °· You have a fever along with knee pain. °· Your knee buckles or locks up. °· Your knee becomes more swollen. °SEEK IMMEDIATE MEDICAL CARE IF:  °· Your knee joint feels hot to the touch. °· You have chest pain or trouble breathing. °  °This information is not intended to replace advice given to you by your health care provider. Make sure you discuss any questions you have with your health care provider. °  °Document Released: 07/15/2007 Document Revised: 10/08/2014 Document Reviewed: 05/03/2014 °Elsevier Interactive Patient Education ©2016 [employer] ° °

## 2016-02-16 NOTE — ED Provider Notes (Signed)
CSN: [id number]     Arrival date & time 02/16/16  1648 History   By signing my name below, I, Ashlee Carlson, attest that this documentation has been prepared under the direction and in the presence of non-physician practitioner, Trixie Dredge, PA-C. Electronically Signed: Marisue Carlson, Scribe. 02/16/2016. 7:43 PM.   Chief Complaint  Patient presents with  . Knee Pain   The history is provided by the patient. No language interpreter was used.   HPI Comments:  Ashlee Carlson is a 55 y.o. female with PMHx of shingles and HTN who presents to the Emergency Department complaining of intermittent BL knee pain onset ~1.5 weeks ago. Pt states pain occurs only when standing up from sitting or sitting down from standing and she experiences stiffness when sitting down for too long. Pt experienced worsening left knee pain last night when putting pressure on left knee. Pt reports associated left knee swelling and intermittent radiation into her shin. She has used "sports alcohol" on both knees without relief. 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 She changed shoes ~2 weeks ago. Pt is a current smoker. Denies fever, recent illness, leg swelling, hip pain, weakness or numbness of the legs.  Denies known injury.    Past Medical History  Diagnosis Date  . Shingles   . Hypertension    Past Surgical History  Procedure Laterality Date  . Breast reduction surgery     No family history on file. Social History  Substance Use Topics  . Smoking status: Current Every Day Smoker -- 0.50 packs/day    Types: Cigarettes  . Smokeless tobacco: None  . Alcohol Use: Yes     Comment: daily   OB History    No data available     Review of Systems  Constitutional: Negative for fever and chills.  Respiratory: Negative for cough.   Cardiovascular: Negative for leg swelling.  Gastrointestinal: Negative for vomiting and diarrhea.  Musculoskeletal: Positive for  joint swelling and arthralgias.  Skin: Negative for color change, pallor, rash and wound.  Allergic/Immunologic: Negative for immunocompromised state.  Neurological: Negative for weakness and numbness.  Psychiatric/Behavioral: Negative for self-injury.    Allergies  Review of patient's allergies indicates no known allergies.  Home Medications   Prior to Admission medications   Medication Sig Start Date End Date Taking? Authorizing Provider  acetaminophen (TYLENOL) 500 MG tablet Take 1 tablet (500 mg total) by mouth every 6 (six) hours as needed. 12/22/15   Cheri Fowler, PA-C  acyclovir (ZOVIRAX) 400 MG tablet Take 1 tablet (400 mg total) by mouth 4 (four) times daily. Patient not taking: Reported on 07/22/2015 02/12/15   Tatyana Kirichenko, PA-C  benzonatate (TESSALON) 100 MG capsule Take 1 capsule (100 mg total) by mouth 3 (three) times daily as needed for cough. 12/22/15   Cheri Fowler, PA-C  cephALEXin (KEFLEX) 500 MG capsule Take 1 capsule (500 mg total) by mouth 4 (four) times daily. 07/22/15   Gavin Pound, MD  chlorpheniramine-HYDROcodone (TUSSIONEX PENNKINETIC ER) 10-8 MG/5ML SUER Take 5 mLs by mouth at bedtime as needed for cough. 12/22/15   Cheri Fowler, PA-C  doxycycline (VIBRAMYCIN) 100 MG capsule Take 1 capsule (100 mg total) by mouth 2 (two) times daily. 07/22/15   Gavin Pound, MD  guaifenesin (COUGH SYRUP) 100 MG/5ML syrup Take 200 mg by mouth 3 (three) times daily as needed for cough.    Historical Provider, MD  HYDROcodone-acetaminophen (NORCO) 5-325 MG per tablet Take  1 tablet by mouth every 6 (six) hours as needed for moderate pain. Patient not taking: Reported on 07/22/2015 02/12/15   Jaynie Crumbleatyana Kirichenko, PA-C  hydrocortisone cream 1 % Apply to affected area 2 times daily Patient not taking: Reported on 07/22/2015 02/12/15   Tatyana Kirichenko, PA-C  ibuprofen (ADVIL,MOTRIN) 800 MG tablet Take 1 tablet (800 mg total) by mouth every 8 (eight) hours as needed for moderate  pain. Patient not taking: Reported on 07/22/2015 03/18/15   Fayrene HelperBowie Tran, PA-C  lisinopril-hydrochlorothiazide (ZESTORETIC) 10-12.5 MG per tablet Take 1 tablet by mouth daily. Patient not taking: Reported on 07/22/2015 12/28/14   Catha GosselinHanna Patel-Mills, PA-C  methocarbamol (ROBAXIN) 500 MG tablet Take 1 tablet (500 mg total) by mouth 2 (two) times daily. Patient not taking: Reported on 07/22/2015 03/18/15   Fayrene HelperBowie Tran, PA-C  ondansetron (ZOFRAN) 4 MG tablet Take 1 tablet (4 mg total) by mouth every 6 (six) hours. 07/22/15   Gavin PoundJustin Brooten, MD  predniSONE (DELTASONE) 20 MG tablet Take 2 tablets (40 mg total) by mouth daily. 07/22/15   Gavin PoundJustin Brooten, MD   BP 190/91 mmHg  Pulse 89  Temp(Src) 98.7 F (37.1 C) (Oral)  Resp 14  Ht 5\' 4"  (1.626 m)  Wt 185 lb (83.915 kg)  BMI 31.74 kg/m2  SpO2 96% Physical Exam  Constitutional: She appears well-developed and well-nourished. No distress.  HENT:  Head: Normocephalic and atraumatic.  Neck: Neck supple.  Cardiovascular: Normal rate.   Pulmonary/Chest: Effort normal.  Musculoskeletal:  BL hips with active ROM without pain; right knee unremarkable; left knee with mild edema, no erythema or tenderness; BL calves soft non-tender; distal sensation intact; distal pulses intact.  Neurological: She is alert. She exhibits normal muscle tone.  Skin: She is not diaphoretic.  Psychiatric: She has a normal mood and affect. Her behavior is normal.  Nursing note and vitals reviewed.   ED Course  Procedures  DIAGNOSTIC STUDIES:  Oxygen Saturation is 96% on RA, normal by my interpretation.    COORDINATION OF CARE:  7:42 PM Will order x-ray of left knee. Discussed treatment plan with pt at bedside and pt agreed to plan.  Labs Review Labs Reviewed - No data to display  Imaging Review Dg Knee Complete 4 Views Left  02/16/2016  CLINICAL DATA:  Intermittent bilateral knee pain for approximately 10 days. Stiffness. No known injury. Initial encounter. EXAM: LEFT  KNEE - COMPLETE 4+ VIEW COMPARISON:  None. FINDINGS: No evidence of fracture, dislocation, or joint effusion. No evidence of arthropathy or other focal bone abnormality. Soft tissues are unremarkable. Very small spur at the quadriceps tendon insertion on the patella is incidentally noted. IMPRESSION: Negative exam. Electronically Signed   By: Drusilla Kannerhomas  Dalessio M.D.   On: 02/16/2016 20:24   I have personally reviewed and evaluated these images and lab results as part of my medical decision-making.   EKG Interpretation None      MDM   Final diagnoses:  Bilateral knee pain  Essential hypertension    Afebrile, nontoxic patient with bilateral knee pain x 1.5 weeks, increase in left knee pain yesterday without injury.  No fevers.  No clinical evidence of septic joint.  Patient works as a Advertising copywriterhousekeeper and changed shoes 2 weeks ago, just before the pain began.  I have advised her to change out of the new shoes.  Xray negative.  Neurovascularly intact.   D/C home with mobic, close PCP follow up.  Pt advised to take her home HTN medications.  Discussed result, findings,  treatment, and follow up  with patient.  Pt given return precautions.  Pt verbalizes understanding and agrees with plan.       I personally performed the services described in this documentation, which was scribed in my presence. The recorded information has been reviewed and is accurate.    Trixie Dredge, PA-C 02/16/16 2120  Arby Barrette, MD 02/16/16 [phone]

## 2016-02-16 NOTE — ED Notes (Signed)
Pt states she takes 2 BP meds but does not take one of them because it causes frequent urination.  Explained rational for that and pt states she did not know, she thought it was a bad side effect.  Pt instructed to take meds as directed. Barnett HatterKallam, Izaiah Tabb P

## 2017-07-28 ENCOUNTER — Emergency Department (HOSPITAL_COMMUNITY): Payer: Self-pay

## 2017-07-28 ENCOUNTER — Emergency Department (HOSPITAL_COMMUNITY)
Admission: EM | Admit: 2017-07-28 | Discharge: 2017-07-29 | Disposition: A | Discharge status: Home / Self Care | Payer: Self-pay | Attending: Emergency Medicine | Admitting: Emergency Medicine

## 2017-07-28 ENCOUNTER — Encounter (HOSPITAL_COMMUNITY): Payer: Self-pay | Admitting: *Deleted

## 2017-07-28 DIAGNOSIS — B9789 Other viral agents as the cause of diseases classified elsewhere: Secondary | ICD-10-CM

## 2017-07-28 DIAGNOSIS — M940 Chondrocostal junction syndrome [Tietze]: Secondary | ICD-10-CM | POA: Insufficient documentation

## 2017-07-28 DIAGNOSIS — J069 Acute upper respiratory infection, unspecified: Secondary | ICD-10-CM | POA: Insufficient documentation

## 2017-07-28 DIAGNOSIS — B349 Viral infection, unspecified: Secondary | ICD-10-CM | POA: Insufficient documentation

## 2017-07-28 NOTE — ED Notes (Signed)
Bed: WA05 Expected date:  Expected time:  Means of arrival:  Comments: 

## 2017-07-28 NOTE — ED Triage Notes (Signed)
Pt c/o productive cough s 1.5 weeks.  Pt also requesting b/p med stating "I didn't think I needed it and haven't taken it in about 3 yrs."

## 2017-07-29 MED ORDER — LORATADINE 10 MG PO TABS
10.0000 mg | ORAL_TABLET | Freq: Once | ORAL | Status: AC
  Administered 2017-07-29: 10 mg via ORAL
  Filled 2017-07-29: qty 1

## 2017-07-29 MED ORDER — ALBUTEROL SULFATE HFA 108 (90 BASE) MCG/ACT IN AERS
2.0000 | INHALATION_SPRAY | Freq: Once | RESPIRATORY_TRACT | Status: AC
  Administered 2017-07-29: 2 via RESPIRATORY_TRACT
  Filled 2017-07-29: qty 6.7

## 2017-07-29 MED ORDER — LORATADINE 10 MG PO TABS: 10.0000 mg | ORAL_TABLET | Freq: Every day | ORAL | 0 refills | Status: DC

## 2017-07-29 MED ORDER — AMLODIPINE BESYLATE 5 MG PO TABS: 5.0000 mg | ORAL_TABLET | Freq: Every day | ORAL | 0 refills | Status: DC

## 2017-07-29 MED ORDER — FLUTICASONE PROPIONATE 50 MCG/ACT NA SUSP
2.0000 | Freq: Every day | NASAL | Status: DC
  Administered 2017-07-29: 2 via NASAL
  Filled 2017-07-29: qty 16

## 2017-07-29 MED ORDER — AMLODIPINE BESYLATE 5 MG PO TABS
5.0000 mg | ORAL_TABLET | Freq: Once | ORAL | Status: AC
  Administered 2017-07-29: 5 mg via ORAL
  Filled 2017-07-29: qty 1

## 2017-07-29 MED ORDER — BENZONATATE 100 MG PO CAPS: 100.0000 mg | ORAL_CAPSULE | Freq: Three times a day (TID) | ORAL | 0 refills | Status: DC | PRN

## 2017-07-29 MED ORDER — BENZONATATE 100 MG PO CAPS
200.0000 mg | ORAL_CAPSULE | Freq: Once | ORAL | Status: AC
  Administered 2017-07-29: 200 mg via ORAL
  Filled 2017-07-29: qty 2

## 2017-07-29 MED ORDER — SALINE SPRAY 0.65 % NA SOLN: 1.0000 | NASAL | 0 refills | Status: DC | PRN

## 2017-07-29 NOTE — Discharge Instructions (Addendum)
Use 2 puffs of an albuterol inhaler every 4-6 hours as needed for cough and shortness of breath.  We also recommend the use of Flonase, 2 sprays in each nostril daily for congestion.  Take Tessalon as needed for cough and Claritin as prescribed for persistent congestion.  Continue ibuprofen 600mg  every 6 hours for chest pain due to frequent coughing.   Your blood pressure was found to be high in the emergency department.  You have been prescribed Norvasc.  Take this medication daily and follow-up with a primary care doctor for recheck of your blood pressure.  You may return to the emergency department for new or concerning symptoms.

## 2017-09-14 NOTE — ED Provider Notes (Signed)
[facility]-EMERGENCY DEPT Provider Note   CSN: [id number] Arrival date & time: 07/28/17  2001     History   Chief Complaint Chief Complaint  Patient presents with  . Cough    1.5 weeks    HPI Ashlee Carlson is a 56 y.o. female.  The history is provided by the patient. No language interpreter was used.  Cough  This is a new problem. Episode onset: 1.5 weeks ago. The problem occurs hourly. The problem has not changed since onset.There has been no fever. Associated symptoms include chest pain (when coughing). Pertinent negatives include no shortness of breath. She is a smoker.    Past Medical History:  Diagnosis Date  . Hypertension   . Shingles     There are no active problems to display for this patient.   Past Surgical History:  Procedure Laterality Date  . BREAST REDUCTION SURGERY      OB History    No data available       Home Medications    Prior to Admission medications   Medication Sig Start Date End Date Taking? Authorizing Provider  acetaminophen (TYLENOL) 500 MG tablet Take 1 tablet (500 mg total) by mouth every 6 (six) hours as needed. 12/22/15   Cheri Fowlerose, Kayla, PA-C  acyclovir (ZOVIRAX) 400 MG tablet Take 1 tablet (400 mg total) by mouth 4 (four) times daily. Patient not taking: Reported on 07/22/2015 02/12/15   Jaynie CrumbleKirichenko, Tatyana, PA-C  amLODipine (NORVASC) 5 MG tablet Take 1 tablet (5 mg total) by mouth daily. 07/29/17   Antony MaduraHumes, Magdalyn Arenivas, PA-C  benzonatate (TESSALON) 100 MG capsule Take 1-2 capsules (100-200 mg total) by mouth 3 (three) times daily as needed for cough. 07/29/17   Antony MaduraHumes, Tamanika Heiney, PA-C  cephALEXin (KEFLEX) 500 MG capsule Take 1 capsule (500 mg total) by mouth 4 (four) times daily. 07/22/15   Gavin PoundBrooten, Justin, MD  chlorpheniramine-HYDROcodone (TUSSIONEX PENNKINETIC ER) 10-8 MG/5ML SUER Take 5 mLs by mouth at bedtime as needed for cough. 12/22/15   Cheri Fowlerose, Kayla, PA-C  doxycycline (VIBRAMYCIN) 100 MG capsule Take 1 capsule  (100 mg total) by mouth 2 (two) times daily. 07/22/15   Gavin PoundBrooten, Justin, MD  guaifenesin (COUGH SYRUP) 100 MG/5ML syrup Take 200 mg by mouth 3 (three) times daily as needed for cough.    [provider]  HYDROcodone-acetaminophen (NORCO) 5-325 MG per tablet Take 1 tablet by mouth every 6 (six) hours as needed for moderate pain. Patient not taking: Reported on 07/22/2015 02/12/15   Jaynie CrumbleKirichenko, Tatyana, PA-C  hydrocortisone cream 1 % Apply to affected area 2 times daily Patient not taking: Reported on 07/22/2015 02/12/15   Jaynie CrumbleKirichenko, Tatyana, PA-C  ibuprofen (ADVIL,MOTRIN) 800 MG tablet Take 1 tablet (800 mg total) by mouth every 8 (eight) hours as needed for moderate pain. Patient not taking: Reported on 07/22/2015 03/18/15   Fayrene Helperran, Bowie, PA-C  lisinopril-hydrochlorothiazide (ZESTORETIC) 10-12.5 MG per tablet Take 1 tablet by mouth daily. Patient not taking: Reported on 07/22/2015 12/28/14   Patel-Mills, Lorelle FormosaHanna, PA-C  loratadine (CLARITIN) 10 MG tablet Take 1 tablet (10 mg total) by mouth daily. 07/29/17   Antony MaduraHumes, Aiyana Stegmann, PA-C  meloxicam (MOBIC) 7.5 MG tablet Take 1 tablet (7.5 mg total) by mouth daily. 02/16/16   Trixie DredgeWest, Emily, PA-C  methocarbamol (ROBAXIN) 500 MG tablet Take 1 tablet (500 mg total) by mouth 2 (two) times daily. Patient not taking: Reported on 07/22/2015 03/18/15   Fayrene Helperran, Bowie, PA-C  ondansetron (ZOFRAN) 4 MG tablet Take 1 tablet (4 mg  total) by mouth every 6 (six) hours. 07/22/15   Gavin Pound, MD  predniSONE (DELTASONE) 20 MG tablet Take 2 tablets (40 mg total) by mouth daily. 07/22/15   Gavin Pound, MD  sodium chloride (OCEAN) 0.65 % SOLN nasal spray Place 1 spray into both nostrils as needed for congestion. 07/29/17   Antony Madura, PA-C    Family History No family history on file.  Social History Social History   Tobacco Use  . Smoking status: Current Every Day Smoker    Packs/day: 0.50    Types: Cigarettes  Substance Use Topics  . Alcohol use: Yes     Comment: daily  . Drug use: No     Allergies   Patient has no known allergies.   Review of Systems Review of Systems  Respiratory: Positive for cough. Negative for shortness of breath.   Cardiovascular: Positive for chest pain (when coughing).  Ten systems reviewed and are negative for acute change, except as noted in the HPI.    Physical Exam Updated Vital Signs BP (!) 162/98 (BP Location: Left Arm)   Pulse 83   Temp 98.4 F (36.9 C) (Oral)   Resp 18   Ht 5\' 4"  (1.626 m)   Wt 83 kg (183 lb)   SpO2 97%   BMI 31.41 kg/m   Physical Exam  Constitutional: She is oriented to person, place, and time. She appears well-developed and well-nourished. No distress.  Nontoxic appearing  HENT:  Head: Normocephalic and atraumatic.  Eyes: Conjunctivae and EOM are normal. No scleral icterus.  Neck: Normal range of motion.  Cardiovascular: Normal rate, regular rhythm and intact distal pulses.  Pulmonary/Chest: Effort normal. No stridor. No respiratory distress. She has no wheezes.  Respirations even and unlabored. No hypoxia.  Musculoskeletal: Normal range of motion.  Neurological: She is alert and oriented to person, place, and time. She exhibits normal muscle tone. Coordination normal.  GCS 15. Patient moving all extremities.  Skin: Skin is warm and dry. No rash noted. She is not diaphoretic. No erythema. No pallor.  Psychiatric: She has a normal mood and affect. Her behavior is normal.  Nursing note and vitals reviewed.    ED Treatments / Results  Labs (all labs ordered are listed, but only abnormal results are displayed) Labs Reviewed - No data to display  EKG  EKG Interpretation None       Radiology No results found.  Procedures Procedures (including critical care time)  Medications Ordered in ED Medications  albuterol (PROVENTIL HFA;VENTOLIN HFA) 108 (90 Base) MCG/ACT inhaler 2 puff (2 puffs Inhalation Given 07/29/17 0040)  benzonatate (TESSALON) capsule 200  mg (200 mg Oral Given 07/29/17 0055)  loratadine (CLARITIN) tablet 10 mg (10 mg Oral Given 07/29/17 0055)  amLODipine (NORVASC) tablet 5 mg (5 mg Oral Given 07/29/17 0055)     Initial Impression / Assessment and Plan / ED Course  I have reviewed the triage vital signs and the nursing notes.  Pertinent labs & imaging results that were available during my care of the patient were reviewed by me and considered in my medical decision making (see chart for details).     CXR negative for acute infiltrate. Patient's symptoms are consistent with URI, likely viral etiology. Discussed that antibiotics are not indicated for viral infections. Patient will be discharged with symptomatic treatment. She verbalizes understanding and is agreeable with plan. Patient is hemodynamically stable and in NAD prior to discharge. Rx for Norvasc also given in light of HTN; hx of  this, but patient has been off medication for a few years. She has been advised to see a primary care doctor for recheck of her blood pressure.   Final Clinical Impressions(s) / ED Diagnoses   Final diagnoses:  Viral URI with cough  Costochondritis, acute    ED Discharge Orders        Ordered    amLODipine (NORVASC) 5 MG tablet  Daily     07/29/17 0039    benzonatate (TESSALON) 100 MG capsule  3 times daily PRN     07/29/17 0039    loratadine (CLARITIN) 10 MG tablet  Daily     07/29/17 0039    sodium chloride (OCEAN) 0.65 % SOLN nasal spray  As needed     07/29/17 0039       Antony MaduraHumes, Trevon Strothers, PA-C 09/14/17 2359    Ward, Layla MawKristen N, DO 09/16/17 2305

## 2017-11-17 ENCOUNTER — Encounter (HOSPITAL_COMMUNITY): Payer: Self-pay | Admitting: Emergency Medicine

## 2017-11-17 ENCOUNTER — Emergency Department (HOSPITAL_COMMUNITY)
Admission: EM | Admit: 2017-11-17 | Discharge: 2017-11-17 | Disposition: A | Discharge status: Home / Self Care | Payer: Medicaid Other | Attending: Emergency Medicine | Admitting: Emergency Medicine

## 2017-11-17 DIAGNOSIS — I10 Essential (primary) hypertension: Secondary | ICD-10-CM | POA: Insufficient documentation

## 2017-11-17 DIAGNOSIS — F17210 Nicotine dependence, cigarettes, uncomplicated: Secondary | ICD-10-CM | POA: Insufficient documentation

## 2017-11-17 DIAGNOSIS — Z79899 Other long term (current) drug therapy: Secondary | ICD-10-CM | POA: Insufficient documentation

## 2017-11-17 MED ORDER — AMLODIPINE BESYLATE 5 MG PO TABS: 5.0000 mg | ORAL_TABLET | Freq: Every day | ORAL | 2 refills | Status: DC

## 2017-11-17 NOTE — ED Provider Notes (Signed)
[facility] EMERGENCY [facility]PART[facility] Provider Note   CSN: [id number] Arrival date & time: 11/17/17  1244     History   Chief Complaint Chief Complaint  Patient presents with  . blood pressure check for job    HPI Ashlee Collumngela G Carlson is a 57 y.o. female.  The history is provided by the patient. No language interpreter was used.  Hypertension  This is a recurrent problem. The current episode started yesterday. The problem occurs constantly. The problem has been gradually worsening. Pertinent negatives include no chest pain and no abdominal pain. Nothing aggravates the symptoms. Nothing relieves the symptoms. She has tried nothing for the symptoms. The treatment provided no relief.  Pt reports she stopped taking her blood pressure medication.  Pt reports she went for a job interview and was told she need to come to the ED for evaluation.  Pt took her bp medication today   Past Medical History:  Diagnosis Date  . Hypertension   . Shingles     There are no active problems to display for this patient.   Past Surgical History:  Procedure Laterality Date  . BREAST REDUCTION SURGERY      OB History    No data available       Home Medications    Prior to Admission medications   Medication Sig Start Date End Date Taking? Authorizing Provider  acetaminophen (TYLENOL) 500 MG tablet Take 1 tablet (500 mg total) by mouth every 6 (six) hours as needed. 12/22/15   Cheri Fowlerose, Kayla, PA-C  acyclovir (ZOVIRAX) 400 MG tablet Take 1 tablet (400 mg total) by mouth 4 (four) times daily. Patient not taking: Reported on 07/22/2015 02/12/15   Jaynie CrumbleKirichenko, Tatyana, PA-C  amLODipine (NORVASC) 5 MG tablet Take 1 tablet (5 mg total) by mouth daily. 11/17/17   Elson AreasSofia, Leslie K, PA-C  benzonatate (TESSALON) 100 MG capsule Take 1-2 capsules (100-200 mg total) by mouth 3 (three) times daily as needed for cough. 07/29/17   Antony MaduraHumes, Kelly, PA-C  cephALEXin (KEFLEX) 500 MG capsule Take 1 capsule (500  mg total) by mouth 4 (four) times daily. 07/22/15   Gavin PoundBrooten, Justin, MD  chlorpheniramine-HYDROcodone (TUSSIONEX PENNKINETIC ER) 10-8 MG/5ML SUER Take 5 mLs by mouth at bedtime as needed for cough. 12/22/15   Cheri Fowlerose, Kayla, PA-C  doxycycline (VIBRAMYCIN) 100 MG capsule Take 1 capsule (100 mg total) by mouth 2 (two) times daily. 07/22/15   Gavin PoundBrooten, Justin, MD  guaifenesin (COUGH SYRUP) 100 MG/5ML syrup Take 200 mg by mouth 3 (three) times daily as needed for cough.    [provider]  HYDROcodone-acetaminophen (NORCO) 5-325 MG per tablet Take 1 tablet by mouth every 6 (six) hours as needed for moderate pain. Patient not taking: Reported on 07/22/2015 02/12/15   Jaynie CrumbleKirichenko, Tatyana, PA-C  hydrocortisone cream 1 % Apply to affected area 2 times daily Patient not taking: Reported on 07/22/2015 02/12/15   Jaynie CrumbleKirichenko, Tatyana, PA-C  ibuprofen (ADVIL,MOTRIN) 800 MG tablet Take 1 tablet (800 mg total) by mouth every 8 (eight) hours as needed for moderate pain. Patient not taking: Reported on 07/22/2015 03/18/15   Fayrene Helperran, Bowie, PA-C  lisinopril-hydrochlorothiazide (ZESTORETIC) 10-12.5 MG per tablet Take 1 tablet by mouth daily. Patient not taking: Reported on 07/22/2015 12/28/14   Patel-Mills, Lorelle FormosaHanna, PA-C  loratadine (CLARITIN) 10 MG tablet Take 1 tablet (10 mg total) by mouth daily. 07/29/17   Antony MaduraHumes, Kelly, PA-C  meloxicam (MOBIC) 7.5 MG tablet Take 1 tablet (7.5 mg total) by mouth daily. 02/16/16  West, Emily, PA-C  methocarbamol (ROBAXIN) 500 MG tablet Take 1 tablet (500 mg total) by mouth 2 (two) times daily. Patient not taking: Reported on 07/22/2015 03/18/15   Fayrene Helper, PA-C  ondansetron (ZOFRAN) 4 MG tablet Take 1 tablet (4 mg total) by mouth every 6 (six) hours. 07/22/15   Gavin Pound, MD  predniSONE (DELTASONE) 20 MG tablet Take 2 tablets (40 mg total) by mouth daily. 07/22/15   Gavin Pound, MD  sodium chloride (OCEAN) 0.65 % SOLN nasal spray Place 1 spray into both nostrils as needed  for congestion. 07/29/17   Antony Madura, PA-C    Family History History reviewed. No pertinent family history.  Social History Social History   Tobacco Use  . Smoking status: Current Every Day Smoker    Packs/day: 0.50    Types: Cigarettes  Substance Use Topics  . Alcohol use: Yes    Comment: daily  . Drug use: No     Allergies   Patient has no known allergies.   Review of Systems Review of Systems  Cardiovascular: Negative for chest pain.  Gastrointestinal: Negative for abdominal pain.  All other systems reviewed and are negative.    Physical Exam Updated Vital Signs BP (!) 167/78 (BP Location: Right Arm)   Pulse 74   Temp 98.5 F (36.9 C) (Oral)   Resp 18   SpO2 100%   Physical Exam  Constitutional: She appears well-developed and well-nourished. No distress.  HENT:  Head: Normocephalic and atraumatic.  Eyes: Conjunctivae are normal.  Neck: Neck supple.  Cardiovascular: Normal rate and regular rhythm.  No murmur heard. Pulmonary/Chest: Effort normal and breath sounds normal. No respiratory distress.  Abdominal: Soft. There is no tenderness.  Musculoskeletal: She exhibits no edema.  Neurological: She is alert.  Skin: Skin is warm and dry.  Psychiatric: She has a normal mood and affect.  Nursing note and vitals reviewed.    ED Treatments / Results  Labs (all labs ordered are listed, but only abnormal results are displayed) Labs Reviewed - No data to display  EKG  EKG Interpretation None       Radiology No results found.  Procedures Procedures (including critical care time)  Medications Ordered in ED Medications - No data to display   Initial Impression / Assessment and Plan / ED Course  I have reviewed the triage vital signs and the nursing notes.  Pertinent labs & imaging results that were available during my care of the patient were reviewed by me and considered in my medical decision making (see chart for details).     MDM  Pt  advised to continue to take her blood pressure medication.  Pt given rx for bp medication and advised to follow up at the wellness clinic for treatment.   Final Clinical Impressions(s) / ED Diagnoses   Final diagnoses:  Hypertension, unspecified type    ED Discharge Orders        Ordered    amLODipine (NORVASC) 5 MG tablet  Daily     11/17/17 1533     No outpatient medications have been marked as taking for the 11/17/17 encounter [facility] Encounter).   An After Visit Summary was printed and given to the patient.    Osie Cheeks 11/17/17 Ayesha Mohair    Benjiman Core, MD 11/17/17 2239

## 2017-11-17 NOTE — ED Triage Notes (Addendum)
Pt states "I had an interview [facility] [employer] for a position and when they checked my BP it was 200/100." Pt states she needs to have a record of her blood pressure to take back with her, she also needs referral for primary care provider. 180s/90s at present. Pt just started amlodipine that she was prescribed in October but was not taking.

## 2017-11-17 NOTE — ED Notes (Signed)
Declined W/C at D/C and was escorted to lobby by RN. 

## 2017-11-17 NOTE — Discharge Instructions (Signed)
Your blood pressure today was 167/78.  Continue to take your medication.  Eat health, decrease salt and increase exercise

## 2022-03-06 ENCOUNTER — Emergency Department (HOSPITAL_COMMUNITY): Payer: 59

## 2022-03-06 ENCOUNTER — Emergency Department (HOSPITAL_COMMUNITY)
Admission: EM | Admit: 2022-03-06 | Discharge: 2022-03-06 | Disposition: A | Discharge status: Home / Self Care | Payer: 59 | Attending: Emergency Medicine | Admitting: Emergency Medicine

## 2022-03-06 ENCOUNTER — Other Ambulatory Visit (HOSPITAL_COMMUNITY): Payer: Self-pay

## 2022-03-06 ENCOUNTER — Other Ambulatory Visit: Payer: Self-pay

## 2022-03-06 DIAGNOSIS — R079 Chest pain, unspecified: Secondary | ICD-10-CM | POA: Diagnosis not present

## 2022-03-06 DIAGNOSIS — Z79899 Other long term (current) drug therapy: Secondary | ICD-10-CM | POA: Diagnosis not present

## 2022-03-06 DIAGNOSIS — I10 Essential (primary) hypertension: Secondary | ICD-10-CM | POA: Diagnosis not present

## 2022-03-06 DIAGNOSIS — R0789 Other chest pain: Secondary | ICD-10-CM | POA: Diagnosis not present

## 2022-03-06 LAB — URINALYSIS, ROUTINE W REFLEX MICROSCOPIC
Bilirubin Urine: NEGATIVE
Glucose, UA: 50 mg/dL — AB
Hgb urine dipstick: NEGATIVE
Ketones, ur: NEGATIVE mg/dL
Leukocytes,Ua: NEGATIVE
Nitrite: NEGATIVE
Protein, ur: NEGATIVE mg/dL
Specific Gravity, Urine: 1.01 (ref 1.005–1.030)
pH: 6 (ref 5.0–8.0)

## 2022-03-06 LAB — CBC WITH DIFFERENTIAL/PLATELET
Abs Immature Granulocytes: 0.02 10*3/uL (ref 0.00–0.07)
Basophils Absolute: 0.1 10*3/uL (ref 0.0–0.1)
Basophils Relative: 1 %
Eosinophils Absolute: 0.1 10*3/uL (ref 0.0–0.5)
Eosinophils Relative: 2 %
HCT: 39.2 % (ref 36.0–46.0)
Hemoglobin: 13.1 g/dL (ref 12.0–15.0)
Immature Granulocytes: 0 %
Lymphocytes Relative: 32 %
Lymphs Abs: 2 10*3/uL (ref 0.7–4.0)
MCH: 31.3 pg (ref 26.0–34.0)
MCHC: 33.4 g/dL (ref 30.0–36.0)
MCV: 93.8 fL (ref 80.0–100.0)
Monocytes Absolute: 0.7 10*3/uL (ref 0.1–1.0)
Monocytes Relative: 11 %
Neutro Abs: 3.4 10*3/uL (ref 1.7–7.7)
Neutrophils Relative %: 54 %
Platelets: 296 10*3/uL (ref 150–400)
RBC: 4.18 MIL/uL (ref 3.87–5.11)
RDW: 13.1 % (ref 11.5–15.5)
WBC: 6.2 10*3/uL (ref 4.0–10.5)
nRBC: 0 % (ref 0.0–0.2)

## 2022-03-06 LAB — COMPREHENSIVE METABOLIC PANEL
ALT: 11 U/L (ref 0–44)
AST: 23 U/L (ref 15–41)
Albumin: 3.8 g/dL (ref 3.5–5.0)
Alkaline Phosphatase: 64 U/L (ref 38–126)
Anion gap: 10 (ref 5–15)
BUN: 26 mg/dL — ABNORMAL HIGH (ref 6–20)
CO2: 25 mmol/L (ref 22–32)
Calcium: 9.5 mg/dL (ref 8.9–10.3)
Chloride: 104 mmol/L (ref 98–111)
Creatinine, Ser: 1.54 mg/dL — ABNORMAL HIGH (ref 0.44–1.00)
GFR, Estimated: 39 mL/min — ABNORMAL LOW (ref >60)
Glucose, Bld: 121 mg/dL — ABNORMAL HIGH (ref 70–99)
Potassium: 3.8 mmol/L (ref 3.5–5.1)
Sodium: 139 mmol/L (ref 135–145)
Total Bilirubin: 0.3 mg/dL (ref 0.3–1.2)
Total Protein: 6.7 g/dL (ref 6.5–8.1)

## 2022-03-06 LAB — TROPONIN I (HIGH SENSITIVITY)
Troponin I (High Sensitivity): 16 ng/L (ref <18)
Troponin I (High Sensitivity): 16 ng/L (ref <18)

## 2022-03-06 MED ORDER — AMLODIPINE BESYLATE 5 MG PO TABS: 5.0000 mg | ORAL_TABLET | Freq: Every day | ORAL | 0 refills | Status: DC

## 2022-03-06 MED ORDER — HYDRALAZINE HCL 25 MG PO TABS
25.0000 mg | ORAL_TABLET | Freq: Once | ORAL | Status: AC
  Administered 2022-03-06: 25 mg via ORAL
  Filled 2022-03-06: qty 1

## 2022-03-06 MED ORDER — AMLODIPINE BESYLATE 5 MG PO TABS
5.0000 mg | ORAL_TABLET | Freq: Every day | ORAL | 0 refills | Status: DC
  Filled 2022-03-06: qty 30, 30d supply, fill #0

## 2022-03-06 NOTE — ED Notes (Signed)
Pt stated she cant void at time.pt has label specimen cup

## 2022-03-06 NOTE — Discharge Instructions (Addendum)
Please start keeping a record of your blood pressure once in the morning and once in the evening.  I would like for you to call our primary care service.  I provided you a number.  Please return to the emergency department for any worsening symptoms.

## 2022-03-06 NOTE — ED Provider Triage Note (Signed)
Emergency Medicine Provider Triage Evaluation Note  Ashlee Carlson , a 61 y.o. female  was evaluated in triage.  Pt complains of chest pain, dental pain, and epistaxis.  Reports that Sunday night she had dental pain and noticed swelling to her gums.  That same evening she had a episode of epistaxis.  Patient reports that swelling has improved.  No further episodes of epistaxis.  Patient states that she has been dealing with chest pain intermittently over the last month.  Pain became worse yesterday evening around 6 PM.  Pain has been intermittent since then.  Last episode of pain started approximately 20 minutes prior.  Pain is located to mid sternum and does not radiate.  Pain is described as a "pinching."  No associated nausea, vomiting, shortness of breath, diaphoresis, palpitations.  Patient does feel lightheaded.  History of hypertension.  Has not been on any medication for the last 6 to 7 years.  Patient states that she was not taking medication as she cannot afford it.  Review of Systems  Positive: Chest pain, dental pain, epistaxis, Negative: Fever, chills, neck pain, nausea, vomiting, diaphoresis, syncope, leg swelling or tenderness  Physical Exam  BP (!) 215/98 (BP Location: Right Arm)   Pulse 74   Temp 98.4 F (36.9 C) (Oral)   Resp 16   SpO2 100%  Gen:   Awake, no distress   Resp:  Normal effort, clear to auscultation bilaterally MSK:   Moves extremities without difficulty; no swelling or edema to bilateral lower extremities Other:  +2 radial pulse bilaterally  Medical Decision Making  Medically screening exam initiated at 7:40 AM.  Appropriate orders placed.  Ashlee Carlson was informed that the remainder of the evaluation will be completed by another provider, this initial triage assessment does not replace that evaluation, and the importance of remaining in the ED until their evaluation is complete.  ACS work-up initiated   Dyann Ruddle 03/06/22  0745

## 2022-03-06 NOTE — ED Provider Notes (Signed)
[facility] EMERGENCY DEPARTMENT Provider Note   CSN: [id number] Arrival date & time: 03/06/22  0731     History Chief Complaint  Patient presents with   Chest Pain    Ashlee Carlson is a 61 y.o. female uncontrolled HTN who presents to the emergency department today for further evaluation of chest pain has been intermittent over the last 2 weeks.  She states her chest pain is substernal and nonradiating.  Patient was work as she works the third shift here in the hospital and had a tooth ache and shortly after her chest pain became worse.  Is still been intermittent since onset.  She denies any associated symptoms.  Patient also mentions that she is not taking any antihypertensives and 6-7 years because she does not have the ability to pay for them.  She reports associated leg swelling bilaterally intermittently.  Denies any swelling right now.   Chest Pain     Home Medications Prior to Admission medications   Medication Sig Start Date End Date Taking? Authorizing Provider  amLODipine (NORVASC) 5 MG tablet Take 1 tablet (5 mg total) by mouth daily. 03/06/22  Yes Meredeth Ide, Justice Milliron M, PA-C  acetaminophen (TYLENOL) 500 MG tablet Take 1 tablet (500 mg total) by mouth every 6 (six) hours as needed. 12/22/15   Cheri Fowler, PA-C  acyclovir (ZOVIRAX) 400 MG tablet Take 1 tablet (400 mg total) by mouth 4 (four) times daily. Patient not taking: Reported on 07/22/2015 02/12/15   Jaynie Crumble, PA-C  benzonatate (TESSALON) 100 MG capsule Take 1-2 capsules (100-200 mg total) by mouth 3 (three) times daily as needed for cough. 07/29/17   Antony Madura, PA-C  cephALEXin (KEFLEX) 500 MG capsule Take 1 capsule (500 mg total) by mouth 4 (four) times daily. 07/22/15   Gavin Pound, MD  chlorpheniramine-HYDROcodone (TUSSIONEX PENNKINETIC ER) 10-8 MG/5ML SUER Take 5 mLs by mouth at bedtime as needed for cough. 12/22/15   Cheri Fowler, PA-C  doxycycline (VIBRAMYCIN) 100 MG capsule Take 1  capsule (100 mg total) by mouth 2 (two) times daily. 07/22/15   Gavin Pound, MD  guaifenesin (COUGH SYRUP) 100 MG/5ML syrup Take 200 mg by mouth 3 (three) times daily as needed for cough.    [provider]  HYDROcodone-acetaminophen (NORCO) 5-325 MG per tablet Take 1 tablet by mouth every 6 (six) hours as needed for moderate pain. Patient not taking: Reported on 07/22/2015 02/12/15   Jaynie Crumble, PA-C  hydrocortisone cream 1 % Apply to affected area 2 times daily Patient not taking: Reported on 07/22/2015 02/12/15   Jaynie Crumble, PA-C  ibuprofen (ADVIL,MOTRIN) 800 MG tablet Take 1 tablet (800 mg total) by mouth every 8 (eight) hours as needed for moderate pain. Patient not taking: Reported on 07/22/2015 03/18/15   Fayrene Helper, PA-C  lisinopril-hydrochlorothiazide (ZESTORETIC) 10-12.5 MG per tablet Take 1 tablet by mouth daily. Patient not taking: Reported on 07/22/2015 12/28/14   Patel-Mills, Lorelle Formosa, PA-C  loratadine (CLARITIN) 10 MG tablet Take 1 tablet (10 mg total) by mouth daily. 07/29/17   Antony Madura, PA-C  meloxicam (MOBIC) 7.5 MG tablet Take 1 tablet (7.5 mg total) by mouth daily. 02/16/16   Trixie Dredge, PA-C  methocarbamol (ROBAXIN) 500 MG tablet Take 1 tablet (500 mg total) by mouth 2 (two) times daily. Patient not taking: Reported on 07/22/2015 03/18/15   Fayrene Helper, PA-C  ondansetron (ZOFRAN) 4 MG tablet Take 1 tablet (4 mg total) by mouth every 6 (six) hours. 07/22/15   Gavin Pound,  MD  predniSONE (DELTASONE) 20 MG tablet Take 2 tablets (40 mg total) by mouth daily. 07/22/15   Gavin Pound, MD  sodium chloride (OCEAN) 0.65 % SOLN nasal spray Place 1 spray into both nostrils as needed for congestion. 07/29/17   Antony Madura, PA-C      Allergies    Patient has no known allergies.    Review of Systems   Review of Systems  Cardiovascular:  Positive for chest pain.  All other systems reviewed and are negative.  Physical Exam Updated Vital Signs BP  (!) 187/96   Pulse 67   Temp 98.7 F (37.1 C)   Resp 18   Ht 5\' 4"  (1.626 m)   Wt 96.2 kg   SpO2 98%   BMI 36.39 kg/m  Physical Exam Vitals and nursing note reviewed.  Constitutional:      General: She is not in acute distress.    Appearance: Normal appearance.  HENT:     Head: Normocephalic and atraumatic.  Eyes:     General:        Right eye: No discharge.        Left eye: No discharge.  Cardiovascular:     Comments: Regular rate and rhythm.  S1/S2 are distinct without any evidence of murmur, rubs, or gallops.  Radial pulses are 2+ bilaterally.  Dorsalis pedis pulses are 2+ bilaterally.  No evidence of pedal edema. Pulmonary:     Comments: Clear to auscultation bilaterally.  Normal effort.  No respiratory distress.  No evidence of wheezes, rales, or rhonchi heard throughout. Abdominal:     General: Abdomen is flat. Bowel sounds are normal. There is no distension.     Tenderness: There is no abdominal tenderness. There is no guarding or rebound.  Musculoskeletal:        General: Normal range of motion.     Cervical back: Neck supple.  Skin:    General: Skin is warm and dry.     Findings: No rash.  Neurological:     General: No focal deficit present.     Mental Status: She is alert.  Psychiatric:        Mood and Affect: Mood normal.        Behavior: Behavior normal.    ED Results / Procedures / Treatments   Labs (all labs ordered are listed, but only abnormal results are displayed) Labs Reviewed  COMPREHENSIVE METABOLIC PANEL - Abnormal; Notable for the following components:      Result Value   Glucose, Bld 121 (*)    BUN 26 (*)    Creatinine, Ser 1.54 (*)    GFR, Estimated 39 (*)    All other components within normal limits  URINALYSIS, ROUTINE W REFLEX MICROSCOPIC - Abnormal; Notable for the following components:   Color, Urine STRAW (*)    Glucose, UA 50 (*)    All other components within normal limits  CBC WITH DIFFERENTIAL/PLATELET  TROPONIN I (HIGH  SENSITIVITY)  TROPONIN I (HIGH SENSITIVITY)    EKG EKG Interpretation  Date/Time:  Tuesday March 06 2022 07:42:26 EDT Ventricular Rate:  72 PR Interval:  136 QRS Duration: 80 QT Interval:  390 QTC Calculation: 427 R Axis:   18 Text Interpretation: Normal sinus rhythm Biatrial enlargement Abnormal ECG When compared with ECG of 05-Jan-2015 13:33, PREVIOUS ECG IS PRESENT similar to prior tracing no stemi Confirmed by 07-Jan-2015 ([id number]) on 03/06/2022 10:30:37 AM  Radiology DG Chest 2 View  Result Date: 03/06/2022 CLINICAL DATA:  Chest pain. EXAM: CHEST - 2 VIEW COMPARISON:  07/28/2017 FINDINGS: Mild cardiac enlargement. No signs of pleural effusion or edema. No airspace opacities identified. The visualized osseous structures are unremarkable. IMPRESSION: No active cardiopulmonary abnormalities. Electronically Signed   By: Signa Kellaylor  Stroud M.D.   On: 03/06/2022 08:14    Procedures Procedures    Medications Ordered in ED Medications  hydrALAZINE (APRESOLINE) tablet 25 mg (25 mg Oral Given 03/06/22 1039)    ED Course/ Medical Decision Making/ A&P Clinical Course as of 03/06/22 1234  Tue Mar 06, 2022  1217 CBC with Differential No evidence of leukocytosis or anemia. [CF]  1217 Comprehensive metabolic panel(!) Elevated glucose and slightly elevated creatinine in comparison to baseline. [CF]  1217 Troponin I (High Sensitivity) Initial delta troponin were negative. [CF]  1217 Urinalysis, Routine w reflex microscopic Urine, Clean Catch(!) Urine does not show any signs of infection. [CF]  1217 DG Chest 2 View I personally ordered and interpreted x-ray which did not show any acute findings.  I do agree with the radiologist interpretation. [CF]    Clinical Course User Index [CF] Teressa LowerFleming, Maynard David M, PA-C                           Medical Decision Making Elvia Collumngela G Burger is a 61 y.o. female with history of uncontrolled hypertension who presents to the emergency department for further  evaluation of chest pain.  Differential diagnosis includes GERD, PUD, angina, ACS.  We will get some cardiac labs and chest x-ray in addition to an EKG for further evaluation. This has been going on for 2 weeks I have a low suspicion for acute ACS at this time.   Amount and/or Complexity of Data Reviewed External Data Reviewed: notes.    Details: Patient has never seen a cardiologist that I can find on old records. Labs:  Decision-making details documented in ED Course. Radiology:  Decision-making details documented in ED Course.  Risk Prescription drug management. Parenteral controlled substances. Risk Details: Patient has significantly elevated blood pressure.  Have given her p.o. hydralazine.  We do starting to downtrend.  I am going to start her on antihypertensives and give her follow-up with [facility].  Overall, her work-up is reassuring and no signs of acute ischemia today.  Strict return precautions were discussed with the patient at the bedside.  She is here for discharge. She is currently chest pain free.    Final Clinical Impression(s) / ED Diagnoses Final diagnoses:  Atypical chest pain    Rx / DC Orders ED Discharge Orders          Ordered    amLODipine (NORVASC) 5 MG tablet  Daily        03/06/22 1229              Honor LohFleming, Caliya Narine Starr SchoolM, [place] JerseyPA-C 03/06/22 1234    Tanda RockersGray, Samuel A, DO 03/10/22 [id number]

## 2022-03-06 NOTE — ED Triage Notes (Signed)
Pt. Stated, Ashlee Carlson had some chest pain that's been there for a month or so.. The chest pain feels like a pinch.. sometimes I feel like Im going to pass out.

## 2022-04-13 ENCOUNTER — Inpatient Hospital Stay: Payer: 59 | Admitting: Nurse Practitioner

## 2022-12-20 ENCOUNTER — Encounter (HOSPITAL_COMMUNITY): Payer: Self-pay | Admitting: *Deleted

## 2022-12-20 ENCOUNTER — Inpatient Hospital Stay (HOSPITAL_COMMUNITY)
Admission: EM | Admit: 2022-12-20 | Discharge: 2022-12-22 | DRG: 065 | Disposition: A | Discharge status: Home / Self Care | Payer: 59 | Attending: Internal Medicine | Admitting: Internal Medicine

## 2022-12-20 ENCOUNTER — Other Ambulatory Visit: Payer: Self-pay

## 2022-12-20 ENCOUNTER — Emergency Department (HOSPITAL_COMMUNITY): Payer: 59

## 2022-12-20 ENCOUNTER — Inpatient Hospital Stay (HOSPITAL_COMMUNITY): Payer: 59

## 2022-12-20 ENCOUNTER — Other Ambulatory Visit: Payer: Self-pay | Admitting: Cardiology

## 2022-12-20 DIAGNOSIS — M2749 Other cysts of jaw: Secondary | ICD-10-CM | POA: Diagnosis present

## 2022-12-20 DIAGNOSIS — E781 Pure hyperglyceridemia: Secondary | ICD-10-CM | POA: Diagnosis not present

## 2022-12-20 DIAGNOSIS — I639 Cerebral infarction, unspecified: Secondary | ICD-10-CM | POA: Diagnosis not present

## 2022-12-20 DIAGNOSIS — Z91148 Patient's other noncompliance with medication regimen for other reason: Secondary | ICD-10-CM

## 2022-12-20 DIAGNOSIS — F17200 Nicotine dependence, unspecified, uncomplicated: Secondary | ICD-10-CM | POA: Diagnosis not present

## 2022-12-20 DIAGNOSIS — Z7151 Drug abuse counseling and surveillance of drug abuser: Secondary | ICD-10-CM | POA: Diagnosis not present

## 2022-12-20 DIAGNOSIS — M8560 Other cyst of bone, unspecified site: Secondary | ICD-10-CM | POA: Diagnosis not present

## 2022-12-20 DIAGNOSIS — I63531 Cerebral infarction due to unspecified occlusion or stenosis of right posterior cerebral artery: Principal | ICD-10-CM | POA: Diagnosis present

## 2022-12-20 DIAGNOSIS — R519 Headache, unspecified: Secondary | ICD-10-CM | POA: Diagnosis not present

## 2022-12-20 DIAGNOSIS — R531 Weakness: Secondary | ICD-10-CM | POA: Diagnosis not present

## 2022-12-20 DIAGNOSIS — F17210 Nicotine dependence, cigarettes, uncomplicated: Secondary | ICD-10-CM | POA: Diagnosis not present

## 2022-12-20 DIAGNOSIS — Z6836 Body mass index (BMI) 36.0-36.9, adult: Secondary | ICD-10-CM

## 2022-12-20 DIAGNOSIS — Z7952 Long term (current) use of systemic steroids: Secondary | ICD-10-CM

## 2022-12-20 DIAGNOSIS — Z716 Tobacco abuse counseling: Secondary | ICD-10-CM | POA: Diagnosis not present

## 2022-12-20 DIAGNOSIS — I160 Hypertensive urgency: Secondary | ICD-10-CM | POA: Diagnosis not present

## 2022-12-20 DIAGNOSIS — Z713 Dietary counseling and surveillance: Secondary | ICD-10-CM | POA: Diagnosis not present

## 2022-12-20 DIAGNOSIS — E669 Obesity, unspecified: Secondary | ICD-10-CM | POA: Diagnosis not present

## 2022-12-20 DIAGNOSIS — Z79899 Other long term (current) drug therapy: Secondary | ICD-10-CM

## 2022-12-20 DIAGNOSIS — R29702 NIHSS score 2: Secondary | ICD-10-CM | POA: Diagnosis not present

## 2022-12-20 DIAGNOSIS — F1410 Cocaine abuse, uncomplicated: Secondary | ICD-10-CM | POA: Diagnosis present

## 2022-12-20 DIAGNOSIS — G8194 Hemiplegia, unspecified affecting left nondominant side: Secondary | ICD-10-CM | POA: Diagnosis present

## 2022-12-20 DIAGNOSIS — I10 Essential (primary) hypertension: Secondary | ICD-10-CM | POA: Diagnosis not present

## 2022-12-20 DIAGNOSIS — Z8673 Personal history of transient ischemic attack (TIA), and cerebral infarction without residual deficits: Secondary | ICD-10-CM | POA: Diagnosis not present

## 2022-12-20 LAB — CBG MONITORING, ED: Glucose-Capillary: 83 mg/dL (ref 70–99)

## 2022-12-20 LAB — I-STAT CHEM 8, ED
BUN: 21 mg/dL — ABNORMAL HIGH (ref 6–20)
Calcium, Ion: 1.19 mmol/L (ref 1.15–1.40)
Chloride: 103 mmol/L (ref 98–111)
Creatinine, Ser: 1.1 mg/dL — ABNORMAL HIGH (ref 0.44–1.00)
Glucose, Bld: 82 mg/dL (ref 70–99)
HCT: 40 % (ref 36.0–46.0)
Hemoglobin: 13.6 g/dL (ref 12.0–15.0)
Potassium: 3.2 mmol/L — ABNORMAL LOW (ref 3.5–5.1)
Sodium: 141 mmol/L (ref 135–145)
TCO2: 27 mmol/L (ref 22–32)

## 2022-12-20 LAB — LIPID PANEL
Cholesterol: 170 mg/dL (ref 0–200)
HDL: 65 mg/dL (ref >40)
LDL Cholesterol: 69 mg/dL (ref 0–99)
Total CHOL/HDL Ratio: 2.6 RATIO
Triglycerides: 179 mg/dL — ABNORMAL HIGH (ref <150)
VLDL: 36 mg/dL (ref 0–40)

## 2022-12-20 LAB — DIFFERENTIAL
Abs Immature Granulocytes: 0.03 10*3/uL (ref 0.00–0.07)
Basophils Absolute: 0.1 10*3/uL (ref 0.0–0.1)
Basophils Relative: 1 %
Eosinophils Absolute: 0.1 10*3/uL (ref 0.0–0.5)
Eosinophils Relative: 2 %
Immature Granulocytes: 0 %
Lymphocytes Relative: 37 %
Lymphs Abs: 2.7 10*3/uL (ref 0.7–4.0)
Monocytes Absolute: 0.9 10*3/uL (ref 0.1–1.0)
Monocytes Relative: 12 %
Neutro Abs: 3.5 10*3/uL (ref 1.7–7.7)
Neutrophils Relative %: 48 %

## 2022-12-20 LAB — COMPREHENSIVE METABOLIC PANEL
ALT: 15 U/L (ref 0–44)
AST: 29 U/L (ref 15–41)
Albumin: 3.8 g/dL (ref 3.5–5.0)
Alkaline Phosphatase: 60 U/L (ref 38–126)
Anion gap: 9 (ref 5–15)
BUN: 17 mg/dL (ref 6–20)
CO2: 26 mmol/L (ref 22–32)
Calcium: 9.3 mg/dL (ref 8.9–10.3)
Chloride: 103 mmol/L (ref 98–111)
Creatinine, Ser: 1.12 mg/dL — ABNORMAL HIGH (ref 0.44–1.00)
GFR, Estimated: 56 mL/min — ABNORMAL LOW (ref >60)
Glucose, Bld: 88 mg/dL (ref 70–99)
Potassium: 3.5 mmol/L (ref 3.5–5.1)
Sodium: 138 mmol/L (ref 135–145)
Total Bilirubin: 0.9 mg/dL (ref 0.3–1.2)
Total Protein: 7 g/dL (ref 6.5–8.1)

## 2022-12-20 LAB — RAPID URINE DRUG SCREEN, HOSP PERFORMED
Amphetamines: NOT DETECTED
Barbiturates: NOT DETECTED
Benzodiazepines: NOT DETECTED
Cocaine: POSITIVE — AB
Opiates: NOT DETECTED
Tetrahydrocannabinol: NOT DETECTED

## 2022-12-20 LAB — HIV ANTIBODY (ROUTINE TESTING W REFLEX): HIV Screen 4th Generation wRfx: NONREACTIVE

## 2022-12-20 LAB — CBC
HCT: 38.9 % (ref 36.0–46.0)
Hemoglobin: 12.8 g/dL (ref 12.0–15.0)
MCH: 30.5 pg (ref 26.0–34.0)
MCHC: 32.9 g/dL (ref 30.0–36.0)
MCV: 92.8 fL (ref 80.0–100.0)
Platelets: 300 10*3/uL (ref 150–400)
RBC: 4.19 MIL/uL (ref 3.87–5.11)
RDW: 13.2 % (ref 11.5–15.5)
WBC: 7.3 10*3/uL (ref 4.0–10.5)
nRBC: 0 % (ref 0.0–0.2)

## 2022-12-20 LAB — HEMOGLOBIN A1C
Hgb A1c MFr Bld: 6.1 % — ABNORMAL HIGH (ref 4.8–5.6)
Mean Plasma Glucose: 128 mg/dL

## 2022-12-20 LAB — PROTIME-INR
INR: 1 (ref 0.8–1.2)
Prothrombin Time: 12.6 seconds (ref 11.4–15.2)

## 2022-12-20 LAB — ANTITHROMBIN III: AntiThromb III Func: 110 % (ref 75–120)

## 2022-12-20 LAB — I-STAT BETA HCG BLOOD, ED (MC, WL, AP ONLY): I-stat hCG, quantitative: <5 m[IU]/mL (ref <5)

## 2022-12-20 LAB — ETHANOL: Alcohol, Ethyl (B): <10 mg/dL (ref <10)

## 2022-12-20 LAB — TSH: TSH: 0.504 u[IU]/mL (ref 0.350–4.500)

## 2022-12-20 LAB — APTT: aPTT: 33 seconds (ref 24–36)

## 2022-12-20 MED ORDER — LABETALOL HCL 5 MG/ML IV SOLN
20.0000 mg | Freq: Once | INTRAVENOUS | Status: AC
  Administered 2022-12-20: 20 mg via INTRAVENOUS
  Filled 2022-12-20: qty 4

## 2022-12-20 MED ORDER — SODIUM CHLORIDE 0.9 % IV SOLN: INTRAVENOUS | Status: DC

## 2022-12-20 MED ORDER — AMLODIPINE BESYLATE 5 MG PO TABS
5.0000 mg | ORAL_TABLET | Freq: Once | ORAL | Status: AC
  Administered 2022-12-20: 5 mg via ORAL
  Filled 2022-12-20: qty 1

## 2022-12-20 MED ORDER — LOSARTAN POTASSIUM 50 MG PO TABS
50.0000 mg | ORAL_TABLET | Freq: Every day | ORAL | Status: DC
  Administered 2022-12-20 – 2022-12-22 (×3): 50 mg via ORAL
  Filled 2022-12-20 (×3): qty 1

## 2022-12-20 MED ORDER — SENNOSIDES-DOCUSATE SODIUM 8.6-50 MG PO TABS: 1.0000 | ORAL_TABLET | Freq: Every evening | ORAL | Status: DC | PRN

## 2022-12-20 MED ORDER — ENOXAPARIN SODIUM 40 MG/0.4ML IJ SOSY
40.0000 mg | PREFILLED_SYRINGE | INTRAMUSCULAR | Status: DC
  Administered 2022-12-20 – 2022-12-21 (×2): 40 mg via SUBCUTANEOUS
  Filled 2022-12-20 (×2): qty 0.4

## 2022-12-20 MED ORDER — ACETAMINOPHEN 650 MG RE SUPP: 650.0000 mg | RECTAL | Status: DC | PRN

## 2022-12-20 MED ORDER — ROSUVASTATIN CALCIUM 5 MG PO TABS
10.0000 mg | ORAL_TABLET | Freq: Every day | ORAL | Status: DC
  Administered 2022-12-20 – 2022-12-22 (×3): 10 mg via ORAL
  Filled 2022-12-20 (×3): qty 2

## 2022-12-20 MED ORDER — CLOPIDOGREL BISULFATE 300 MG PO TABS
300.0000 mg | ORAL_TABLET | Freq: Once | ORAL | Status: AC
  Administered 2022-12-20: 300 mg via ORAL
  Filled 2022-12-20: qty 1

## 2022-12-20 MED ORDER — EZETIMIBE 10 MG PO TABS: 10.0000 mg | ORAL_TABLET | Freq: Every day | ORAL | Status: DC

## 2022-12-20 MED ORDER — STROKE: EARLY STAGES OF RECOVERY BOOK
Freq: Once | Status: AC
  Filled 2022-12-20: qty 1

## 2022-12-20 MED ORDER — ACETAMINOPHEN 325 MG PO TABS
650.0000 mg | ORAL_TABLET | ORAL | Status: DC | PRN
  Administered 2022-12-22: 650 mg via ORAL
  Filled 2022-12-20 (×2): qty 2

## 2022-12-20 MED ORDER — SODIUM CHLORIDE 0.9% FLUSH
3.0000 mL | Freq: Once | INTRAVENOUS | Status: AC
  Administered 2022-12-20: 3 mL via INTRAVENOUS

## 2022-12-20 MED ORDER — ASPIRIN 81 MG PO TBEC
81.0000 mg | DELAYED_RELEASE_TABLET | Freq: Every day | ORAL | Status: DC
  Administered 2022-12-20 – 2022-12-22 (×3): 81 mg via ORAL
  Filled 2022-12-20 (×3): qty 1

## 2022-12-20 MED ORDER — ACETAMINOPHEN 160 MG/5ML PO SOLN: 650.0000 mg | ORAL | Status: DC | PRN

## 2022-12-20 MED ORDER — HYDRALAZINE HCL 20 MG/ML IJ SOLN
10.0000 mg | INTRAMUSCULAR | Status: DC | PRN
  Administered 2022-12-21: 10 mg via INTRAVENOUS
  Filled 2022-12-20 (×4): qty 1

## 2022-12-20 MED ORDER — CLOPIDOGREL BISULFATE 75 MG PO TABS
75.0000 mg | ORAL_TABLET | Freq: Every day | ORAL | Status: DC
  Administered 2022-12-21 – 2022-12-22 (×2): 75 mg via ORAL
  Filled 2022-12-20 (×2): qty 1

## 2022-12-20 MED ORDER — IOHEXOL 350 MG/ML SOLN
75.0000 mL | Freq: Once | INTRAVENOUS | Status: AC | PRN
  Administered 2022-12-20: 75 mL via INTRAVENOUS

## 2022-12-20 MED ORDER — FENOFIBRATE 54 MG PO TABS
54.0000 mg | ORAL_TABLET | Freq: Every day | ORAL | Status: DC
  Administered 2022-12-20 – 2022-12-22 (×3): 54 mg via ORAL
  Filled 2022-12-20 (×3): qty 1

## 2022-12-20 MED ORDER — AMLODIPINE BESYLATE 10 MG PO TABS
10.0000 mg | ORAL_TABLET | Freq: Every day | ORAL | Status: DC
  Administered 2022-12-21 – 2022-12-22 (×2): 10 mg via ORAL
  Filled 2022-12-20 (×2): qty 1

## 2022-12-20 MED ORDER — LORAZEPAM 2 MG/ML IJ SOLN: 0.5000 mg | Freq: Four times a day (QID) | INTRAMUSCULAR | Status: DC | PRN

## 2022-12-20 NOTE — Evaluation (Signed)
Occupational Therapy Evaluation Patient Details Name: Ashlee Carlson MRN: [id number] DOB: 14-Oct-1960 Today's Date: 12/20/2022   History of Present Illness Pt is a 62 y/o female presenting on 3/21 with L sided numbness and weakness x 2 days. CT reveals several subacute areas, including occipital lobe. MRI with scattered diffusion restriction and cytotoxic edema in R PCA territory (including thalamus, occipital pole). PMH includes: HTN, shingles.   Clinical Impression   PTA patient reports independent and working. Admitted for above and presents with problem list below. She completes ADLs with up to min assist, transfers and functional mobility with min guard assist. She reports intermittent blurry vision, but [facility] today; LUE with decreased sensation and coordination.  Limited session due to fatigue (pt typically works 3rd shift), encouraged use of L UE as much as possible, and educated on exercises to complete (tip to tip).  She reports family can provide support if needed at dc.  Based on performance today, believe she will best benefit from further OT services in the outpatient session to optimize independence, safety for return to PLOF.      Recommendations for follow up therapy are one component of a multi-disciplinary discharge planning process, led by the attending physician.  Recommendations may be updated based on patient status, additional functional criteria and insurance authorization.   Follow Up Recommendations  Outpatient OT (neuro)     Assistance Recommended at Discharge Intermittent Supervision/Assistance  Patient can return home with the following A little help with walking and/or transfers;A little help with bathing/dressing/bathroom;Assistance with cooking/housework;Assist for transportation;Help with stairs or ramp for entrance    Functional Status Assessment  Patient has had a recent decline in their functional status and demonstrates the ability to make significant  improvements in function in a reasonable and predictable amount of time.  Equipment Recommendations  BSC/3in1    Recommendations for Other Services       Precautions / Restrictions Precautions Precautions: Fall Restrictions Weight Bearing Restrictions: No      Mobility Bed Mobility Overal bed mobility: Needs Assistance Bed Mobility: Supine to Sit, Sit to Supine     Supine to sit: Min guard Sit to supine: Min guard        Transfers Overall transfer level: Needs assistance   Transfers: Sit to/from Stand Sit to Stand: Min guard           General transfer comment: for safety, mild unsteadiness      Balance Overall balance assessment: Mild deficits observed, not formally tested                                         ADL either performed or assessed with clinical judgement   ADL Overall ADL's : Needs assistance/impaired     Grooming: Min guard;Standing           Upper Body Dressing : Set up;Sitting   Lower Body Dressing: Minimal assistance;Sit to/from stand   Toilet Transfer: Min guard;Ambulation   Toileting- Clothing Manipulation and Hygiene: Min guard;Sitting/lateral lean;Sit to/from stand       Functional mobility during ADLs: Min guard;Cueing for safety       Vision Baseline Vision/History: 1 Wears glasses (reading) Ability to See in Adequate Light: 1 Impaired Patient Visual Report: Blurring of vision Vision Assessment?: Yes Eye Alignment: Within Functional Limits Ocular Range of Motion: Within Functional Limits Alignment/Gaze Preference: Within Defined Limits Tracking/Visual Pursuits: Able to track  stimulus in all quads without difficulty Saccades: Within functional limits Convergence: Within functional limits Visual Fields: No apparent deficits Additional Comments: appears WFL, pt denies blurry vision today     Perception     Praxis      Pertinent Vitals/Pain Pain Assessment Pain Assessment: No/denies pain      Hand Dominance Right   Extremity/Trunk Assessment Upper Extremity Assessment Upper Extremity Assessment: LUE deficits/detail LUE Deficits / Details: grossly 4/5 MMT, decreased coordination and sensation LUE Sensation: decreased light touch LUE Coordination: decreased fine motor;decreased gross motor   Lower Extremity Assessment Lower Extremity Assessment: Defer to PT evaluation   Cervical / Trunk Assessment Cervical / Trunk Assessment: Normal   Communication Communication Communication: No difficulties   Cognition Arousal/Alertness: Awake/alert Behavior During Therapy: WFL for tasks assessed/performed Overall Cognitive Status: Within Functional Limits for tasks assessed                                 General Comments: reports hx of decreased memory, but baseline; appears [facility]     General Comments  parents at side and supportive; BP elevated to 198/102 at end of session and RN notified    Exercises     Shoulder Instructions      Home Living Family/patient expects to be discharged to:: Private residence Living Arrangements: Alone Available Help at Discharge: Family Type of Home: House Home Access: Stairs to enter [employer] of Steps: 6 front, 3 back Entrance Stairs-Rails: Right (in front only) Home Layout: One level     Bathroom Shower/Tub: Teacher, early years/pre: Standard     Home Equipment: Cane - single point   Additional Comments: daughter available as needed, mom reports pt can stay with them as needed as well      Prior Functioning/Environment Prior Level of Function : Independent/Modified Independent;Driving;Working/employed               ADLs Comments: 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        OT Problem List: Decreased activity tolerance;Impaired balance (sitting and/or standing);Decreased coordination;Impaired UE functional use;Impaired sensation      OT Treatment/Interventions: Self-care/ADL  training;Neuromuscular education;DME and/or AE instruction;Therapeutic activities;Patient/family education;Balance training    OT Goals(Current goals can be found in the care plan section) Acute Rehab OT Goals Patient Stated Goal: get better OT Goal Formulation: With patient Time For Goal Achievement: 01/03/23 Potential to Achieve Goals: Good  OT Frequency: Min 2X/week    Co-evaluation              AM-PAC OT "6 Clicks" Daily Activity     Outcome Measure Help from another person eating meals?: A Little Help from another person taking care of personal grooming?: A Little Help from another person toileting, which includes using toliet, bedpan, or urinal?: A Little Help from another person bathing (including washing, rinsing, drying)?: A Little Help from another person to put on and taking off regular upper body clothing?: A Little Help from another person to put on and taking off regular lower body clothing?: A Little 6 Click Score: 18   End of Session Nurse Communication: Mobility status  Activity Tolerance: Patient tolerated treatment well Patient left: in bed;with call bell/phone within reach;with bed alarm set;with family/visitor present  OT Visit Diagnosis: Other abnormalities of gait and mobility (R26.89);Other symptoms and signs involving the nervous system (R29.898)  Time: [id number] OT Time Calculation (min): 23 min Charges:  OT General Charges $OT Visit: 1 Visit OT Evaluation $OT Eval Moderate Complexity: 1 Mod  Jolaine Artist, OT Acute Rehabilitation Services Office [phone]   [name] 12/20/2022, 1:56 PM

## 2022-12-20 NOTE — ED Notes (Signed)
Pt to MRI at this time.

## 2022-12-20 NOTE — Progress Notes (Addendum)
STROKE TEAM PROGRESS NOTE   INTERVAL HISTORY No family at bedside.  Plan of care reviewed with patient. Stroke workup continuing.  Neuroexam stable.  Vitals:   12/20/22 1002 12/20/22 1205 12/20/22 1248 12/20/22 1330  BP: (!) 200/93 (!) 198/102 (!) 212/96 (!) 155/94  Pulse: 76 79 85 85  Resp: (!) 21 (!) 23 (!) 27 19  Temp: 98.2 F (36.8 C) 98.6 F (37 C)  98.6 F (37 C)  TempSrc: Oral     SpO2: 100% 99% 98% 100%  Height:       CBC:  Recent Labs  Lab 12/20/22 0535 12/20/22 0545  WBC 7.3  --   NEUTROABS 3.5  --   HGB 12.8 13.6  HCT 38.9 40.0  MCV 92.8  --   PLT 300  --    Basic Metabolic Panel:  Recent Labs  Lab 12/20/22 0535 12/20/22 0545  NA 138 141  K 3.5 3.2*  CL 103 103  CO2 26  --   GLUCOSE 88 82  BUN 17 21*  CREATININE 1.12* 1.10*  CALCIUM 9.3  --    Lipid Panel:  Recent Labs  Lab 12/20/22 0825  CHOL 170  TRIG 179*  HDL 65  CHOLHDL 2.6  VLDL 36  LDLCALC 69   HgbA1c: No results for input(s): "HGBA1C" in the last 168 hours. Urine Drug Screen:  Recent Labs  Lab 12/20/22 0954  LABOPIA NONE DETECTED  COCAINSCRNUR POSITIVE*  LABBENZ NONE DETECTED  AMPHETMU NONE DETECTED  THCU NONE DETECTED  LABBARB NONE DETECTED    Alcohol Level  Recent Labs  Lab 12/20/22 0535  ETH <10    IMAGING past 24 hours VAS [place] LOWER EXTREMITY VENOUS (DVT)  Result Date: 12/20/2022  Lower Venous DVT Study Patient Name:  Ashlee Carlson  Date of Exam:   12/20/2022 Medical Rec #: [id number]         Accession #:    [id number] Date of Birth: Feb 26, 1961        Patient Gender: F Patient Age:   62 years Exam Location:  [facility] Procedure:      VAS [place] LOWER EXTREMITY VENOUS (DVT) Referring Phys: Cornelius Moras Kimbra Marcelino --------------------------------------------------------------------------------  Other Indications: Embolic stroke. Comparison Study: No previous study. Performing Technologist: McKayla Maag RVT, [place]  Examination Guidelines: A complete evaluation includes B-mode  imaging, spectral Doppler, color Doppler, and power Doppler as needed of all accessible portions of each vessel. Bilateral testing is considered an integral part of a complete examination. Limited examinations for reoccurring indications may be performed as noted. The reflux portion of the exam is performed with the patient in reverse Trendelenburg.  +---------+---------------+---------+-----------+----------+--------------+ RIGHT    CompressibilityPhasicitySpontaneityPropertiesThrombus Aging +---------+---------------+---------+-----------+----------+--------------+ CFV      Full           Yes      Yes                                 +---------+---------------+---------+-----------+----------+--------------+ SFJ      Full                                                        +---------+---------------+---------+-----------+----------+--------------+ FV Prox  Full                                                        +---------+---------------+---------+-----------+----------+--------------+  FV Mid   Full                                                        +---------+---------------+---------+-----------+----------+--------------+ FV DistalFull                                                        +---------+---------------+---------+-----------+----------+--------------+ PFV      Full                                                        +---------+---------------+---------+-----------+----------+--------------+ POP      Full           Yes      Yes                                 +---------+---------------+---------+-----------+----------+--------------+ PTV      Full                                                        +---------+---------------+---------+-----------+----------+--------------+ PERO     Full                                                        +---------+---------------+---------+-----------+----------+--------------+    +---------+---------------+---------+-----------+----------+--------------+ LEFT     CompressibilityPhasicitySpontaneityPropertiesThrombus Aging +---------+---------------+---------+-----------+----------+--------------+ CFV      Full           Yes      Yes                                 +---------+---------------+---------+-----------+----------+--------------+ SFJ      Full                                                        +---------+---------------+---------+-----------+----------+--------------+ FV Prox  Full                                                        +---------+---------------+---------+-----------+----------+--------------+ FV Mid   Full                                                        +---------+---------------+---------+-----------+----------+--------------+   FV DistalFull                                                        +---------+---------------+---------+-----------+----------+--------------+ PFV      Full                                                        +---------+---------------+---------+-----------+----------+--------------+ POP      Full           Yes      Yes                                 +---------+---------------+---------+-----------+----------+--------------+ PTV      Full                                                        +---------+---------------+---------+-----------+----------+--------------+ PERO     Full                                                        +---------+---------------+---------+-----------+----------+--------------+     Summary: BILATERAL: - No evidence of deep vein thrombosis seen in the lower extremities, bilaterally. - No evidence of superficial venous thrombosis in the lower extremities, bilaterally. -No evidence of popliteal cyst, bilaterally.   *See table(s) above for measurements and observations.    Preliminary    CT ANGIO HEAD NECK W WO CM  Addendum  Date: 12/20/2022   ADDENDUM REPORT: 12/20/2022 09:39 ADDENDUM: Study discussed by telephone with Dr. Fuller Plan on 12/20/2022 at 0927 hours. Electronically Signed   By: Genevie Ann M.D.   On: 12/20/2022 09:39   Result Date: 12/20/2022 CLINICAL DATA:  62 year old female with scattered right PCA territory infarction on brain MRI this morning. Left-sided hand face and leg symptoms beginning several nights ago. Headache. EXAM: CT ANGIOGRAPHY HEAD AND NECK TECHNIQUE: Multidetector CT imaging of the head and neck was performed using the standard protocol during bolus administration of intravenous contrast. Multiplanar CT image reconstructions and MIPs were obtained to evaluate the vascular anatomy. Carotid stenosis measurements (when applicable) are obtained utilizing NASCET criteria, using the distal internal carotid diameter as the denominator. RADIATION DOSE REDUCTION: This exam was performed according to the departmental dose-optimization program which includes automated exposure control, adjustment of the mA and/or kV according to patient size and/or use of iterative reconstruction technique. CONTRAST:  11mL OMNIPAQUE IOHEXOL 350 MG/ML SOLN COMPARISON:  CT head and MRI brain earlier today. Previous face CT 12/05/2003. 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 Narrow zone of transition. This expansile bone lesion is associated with the  roots of the left maxillary incisors, the lateral incisor of which is abnormal. No other acute osseous abnormality identified with mild for age cervical spine degeneration. Upper chest: Negative. Other neck: No acute soft tissue finding identified in the neck. Aortic arch: 3 vessel arch configuration. No significant arch atherosclerosis. Right carotid system: Negative aside  from mild tortuosity. Left carotid system: Negative aside from mild tortuosity. Vertebral arteries: Minimal proximal right subclavian artery plaque without stenosis. Diminutive right vertebral artery origin is patent without stenosis. Right vertebral is non dominant and remains diminutive but patent to the skull base. Proximal left subclavian artery and left vertebral artery origin are normal. The left vertebral is dominant, mildly hyperplastic and tortuous to the skull base. CTA HEAD Posterior circulation: No left vertebral artery plaque or stenosis to the basilar. Non dominant right vertebral functionally terminates in PICA. Patent basilar artery without stenosis. Patent SCA origins and basilar tip. Both PCA origins are patent, but the right PCA is occluded in the P1/P2 segment junction on series 10, image 19. Poor distal enhancement. Contralateral left PCA branches are patent although with widespread mild PCA irregularity on series 12, image 23. Anterior circulation: Both ICA siphons are patent. On the right there is mild siphon irregularity and stenosis. On the left there is supraclinoid calcified plaque and irregularity but only mild stenosis. Small left posterior communicating artery is identified. Patent carotid termini, MCA and ACA origins. Subtle irregularity at the left MCA origin, no significant stenosis. Anterior communicating artery and bilateral ACA branches are within normal limits. Right MCA M1 segment and bifurcation are patent without stenosis. Left MCA M1 segment and trifurcation are patent without stenosis. Bilateral MCA branches are patent with mild mostly M3 segment irregularity. Venous sinuses: Patent. Anatomic variants: Dominant left and highly diminutive right vertebral arteries. The left supplies the basilar and the right functionally terminates in PICA. Review of the MIP images confirms the above findings IMPRESSION: 1. Positive for Right PCA occlusion at the P1/P2 segment junction. 2. No  other large vessel occlusion. Mild for age atherosclerosis in the neck. Although more pronounced intracranial atherosclerosis affecting the 2nd and 3rd order circle-of-Willis branches. No other significant arterial stenosis. 3. 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 This is new since the 2005 face CT, and identified in retrospect on the Brain MRI this morning which suggests inspissated/complex fluid within the cyst. Recommend follow-up with OMFS. Electronically Signed: By: Genevie Ann M.D. On: 12/20/2022 09:21   MR BRAIN WO CONTRAST  Result Date: 12/20/2022 CLINICAL DATA:  62 year old female with weakness of left face, hand and leg beginning several nights ago. Headache. EXAM: MRI HEAD WITHOUT CONTRAST TECHNIQUE: Multiplanar, multiecho pulse sequences of the brain and surrounding structures were obtained without intravenous contrast. COMPARISON:  Head CT 0547 hours today. FINDINGS: Brain: Patchy multifocal diffusion restriction in the right PCA territory: - occipital pole series 5, image 61 - central right thalamus image 71 -but also throughout the right hippocampal formation (series 5, image 66) as well as punctate involvement of nearby right periatrial white matter. These areas demonstrate 2 2/FLAIR hyperintense cytotoxic edema. No hemorrhagic transformation or mass effect. No other diffusion restriction. But chronic lacunar infarcts in the bilateral other deep gray nuclei, and patchy/confluent T2 and FLAIR hyperintensity in the bilateral corona radiata. Similar T2 heterogeneity in the pons. No cortical encephalomalacia or chronic cerebral blood products identified. Cerebellum appears spared.  No midline shift, mass effect, evidence of mass lesion, ventriculomegaly, extra-axial collection or acute intracranial hemorrhage. Cervicomedullary junction and pituitary are within normal  limits. Vascular: Major intracranial vascular flow voids are preserved, distal left vertebral artery appears to be dominant. Skull and upper cervical spine: Negative. Visualized bone marrow signal is within normal limits. Sinuses/Orbits: Rightward gaze, otherwise negative. Other: Grossly normal visible internal auditory structures. Mastoids are clear. Visible scalp and face appear negative. IMPRESSION: 1. Scattered diffusion restriction and cytotoxic edema in several areas of the Right PCA territory (including thalamus, occipital pole). Conspicuous involvement of the right hippocampus, but favor acute to subacute ischemia in light of underlying advanced small vessel disease (#3) rather than seizure/status epilepticus or drug related excitotoxicity. 2. No associated hemorrhage or mass effect. 3. 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 Electronically Signed   By: Genevie Ann M.D.   On: 12/20/2022 07:38   CT HEAD WO CONTRAST  Result Date: 12/20/2022 CLINICAL DATA:  62 year old female with weakness of left face, hand and leg beginning several nights ago. Headache. EXAM: CT HEAD WITHOUT CONTRAST TECHNIQUE: Contiguous axial images were obtained from the base of the skull through the vertex without intravenous contrast. RADIATION DOSE REDUCTION: This exam was performed according to the departmental dose-optimization program which includes automated exposure control, adjustment of the mA and/or kV according to patient size and/or use of iterative reconstruction technique. COMPARISON:  Head CT 01/05/2015. FINDINGS: Brain: Cerebral volume remains normal for age. But Patchy and confluent bilateral cerebral white matter hypodensity and moderate heterogeneity throughout the bilateral deep gray nuclei has progressed since 2016. Asymmetric involvement is greater on the right side including at the caudate (series 3, image 17) and  thalamus (image 15). Furthermore, there is a small area of cortical hypodensity in the right occipital pole on series 13 which is new seems new or increased. No superimposed midline shift, ventriculomegaly, mass effect, evidence of mass lesion, intracranial hemorrhage. No other acute cortically based infarct identified. Vascular: Calcified atherosclerosis at the skull base. No suspicious intracranial vascular hyperdensity. Skull: No acute osseous abnormality identified. Sinuses/Orbits: Left maxillary sinus periosteal thickening, but opacification of that sinus has resolved since 2016. Other Visualized paranasal sinuses and mastoids are stable and well aerated. Other: Slight leftward gaze. No other acute orbit or scalp soft tissue finding. IMPRESSION: 1. Positive for Age Indeterminate Ischemic changes in the brain, mostly small vessel type including extensive involvement of the deep gray nuclei. But also difficult to exclude a small acute or subacute occipital pole Right PCA territory infarct. 2. No acute intracranial hemorrhage or mass effect. Electronically Signed   By: Genevie Ann M.D.   On: 12/20/2022 06:05    PHYSICAL EXAM  Temp:  [98 F (36.7 C)-98.6 F (37 C)] 98.6 F (37 C) (03/21 1330) Pulse Rate:  [74-95] 85 (03/21 1330) Resp:  [17-27] 19 (03/21 1330) BP: (155-220)/(74-150) 155/94 (03/21 1330) SpO2:  [98 %-100 %] 100 % (03/21 1330)  General - Well nourished, well developed, in no apparent distress.  Cardiovascular - Regular rhythm and rate.  Neuro: Mental Status: Awake, alert, oriented to person, place, month, year, and situation. Good attention, able to give a clear and coherent history. No aphasia present. Cranial Nerves: II:?  Partial left upper quadrant field cut.  PERRL.  III,IV, VI: EOMI without ptosis or diploplia.  V: Facial sensation is diminished on the left VII: Facial movement with mild left-sided weakness  VIII: hearing is intact to voice X: Uvula elevates  symmetrically XI: Shoulder shrug is symmetric. XII: tongue is midline without atrophy or fasciculations.  Motor: Tone is normal. Bulk is normal.  RUE: 5/5, no drift, 5/5 grip LUE: 4/5, no drift, 4/5 grip Decreased grip strength on the left, good strength in bilateral legs Sensory: Sensation is diminished throughout the left side Cerebellar: No clear ataxia   ASSESSMENT/PLAN Ashlee Carlson is a 62 y.o. female with a history of hypertension who presents with left-sided numbness that started abruptly Monday evening.  She states that is been essentially the same since it started, not getting worse and not getting better.  CT/MRI reveals small vessel disease, questionable right PCA territory infarct, which would explain her left-sided numbness.   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 No acute intracranial hemorrhage or mass effect CTA head & neck: pending Positive for Right PCA occlusion at the P1/P2 segment junction. More pronounced intracranial atherosclerosis affecting the 2nd and 3rd order circle-of-Willis branches.  MRI:  Scattered diffusion restriction and cytotoxic edema in several areas of the Right PCA territory. Underlying advance small vessel disease. TEE pending VAS [place] LE DVT: Negative Recommend 30 day cardiac event monitoring as outpt to rule out afib if TEE neg Hypercoagulable Panel pending LDL: 69 HgbA1c: pending Hypercoagulable work up pending VTE prophylaxis - lovenox No antithrombotic prior to admission, now on aspirin 81 mg daily and clopidogrel 75 mg daily for 3 months, then aspirin alone given right PCA occlusion.  Therapy recommendations:  outpt PT/OT Disposition:  pending  Hypertension Hypertensive Urgency Home meds:  norvasc 5mg , patient reported poor  compliance Losartan 50mg  added, Norvasc increase to 10mg  Unstable, high on presentation On norvasc 10 and add losartan 50 Symptoms are now over 109 days old, no need for permissive hypertension BP goal normotensive  Hyperlipidemia Home meds:  none LDL 69., goal < 70 Add Crestor 10mg  for secondary stroke prevention No high intensity statin due to LDL within goal. 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 Note/plan to be edited by MD as needed.    Otelia Santee, DNP, AGACNP-BC [facility] Please use AMION for pager and EPIC for messaging  ATTENDING NOTE: I reviewed above note and agree with the assessment and plan. Pt was seen and examined.   Parents and daughter are at the bedside. Pt lying in bed, still complaining of left sided numbness, but seems improving. Complaining left hand clumsy since stroke. But no vision changes. MRI showed right MCA stroke and CTA showed right PCA occlusion. Will check TEE in am. LDL 69 but UDS showed positive on cocaine. BP high on presentation. She has not taken BP meds consistently at home and not check BP at home  On exam, awake, alert, eyes open, orientated to age, place, time  and people. No aphasia, fluent language, following all simple commands. Able to name and repeat and read. No gaze palsy, tracking bilaterally, visual field full. No facial droop. Tongue midline. RUE and RLE 5/5, LUE proximal 5/5, distal finger grip 4/5 with  decreased dexterity. LLE lack of effort on exam 4/5 proximal and distal, b/l FTN intact grossly although slow on the left, gait not tested.   Pt stroke likely large vessel disease given uncontrolled risk factors with HTN, smoker and cocaine use. Cardioembolic source less likely but will do TEE and 30 day monitoring given her young age. Will also check hypercoagulable labs. On DAPT for 3 month and low dose statin. Will follow.   For detailed assessment and plan, please refer to above/below as I have made changes wherever appropriate.   Rosalin Hawking, MD PhD Stroke Neurology 12/20/2022 4:58 PM     To contact Stroke Continuity provider, please refer to [email or web address] After hours, contact General Neurology

## 2022-12-20 NOTE — ED Notes (Signed)
Pt back from MRI 

## 2022-12-20 NOTE — ED Notes (Addendum)
Neurologist Dr. Leonel Ramsay at the bedside for neuro consult

## 2022-12-20 NOTE — Progress Notes (Signed)
Stroke education given to pt and her family. Specifically discussed BEFAST 911 activation and individual risk factors (smoking, HTN).

## 2022-12-20 NOTE — H&P (Signed)
History and Physical    Patient: Ashlee Carlson [id number] DOB: [birth year] DOA: 12/20/2022 DOS: the patient was seen and examined on 12/20/2022 PCP: Patient, No Pcp Per  Patient coming from: Home  Chief Complaint:  Chief Complaint  Patient presents with   Weakness   Stroke Symptoms   HPI: BAWI BISHARA is a 62 y.o. female with medical history significant of hypertension and prior history of shingles who presents with complaints of left-sided numbness and weakness which started 3 days ago.  Patient reported initially feeling numbness and tingling of the left side of her face.  Sensations progressed to her hand and foot over the next day.  Patient initially disregarded symptoms, but last day had noticed weakness as well with c/o difficulty walking.  She is right-handed, but had noticed that she had been dropping her keys when holding them in her left hand.  Denied having any complaints of headache during this time, but at reported intermittently having frontal headaches over the last several weeks.  Patient noted associated symptoms of blurred vision.  She supposed be on blood pressure medications, but reports that she not been taking them for quite some long.  In the emergency department patient tachypneic with blood pressures initially elevated up to 220/106 and all other vital signs maintained.  CT scan of the head noted age-indeterminate ischemic changes in the brain mostly small vessel type including extensive involvement of the deep gray matter.  Neurology had been consulted, but patient was not a candidate for tPA due to being outside of the window.  Labs appear near patient's baseline.  Alcohol level was undetectable.  Subsequently, MRI of the brain noted scattered diffusion restriction and cytotoxic edema in several areas of the right PCA territory with possible involvement of the right hippocampus and advanced chronic small vessel disease bilaterally.  Patient has been started on  aspirin and Plavix per neurology recommendations.  She had also received labetalol 20 mg IV and amlodipine 5 mg p.o. due to elevated blood pressures. [facility] called to admit for completion of stroke workup.  Review of Systems: As mentioned in the history of present illness. All other systems reviewed and are negative. Past Medical History:  Diagnosis Date   Hypertension    Shingles    Past Surgical History:  Procedure Laterality Date   BREAST REDUCTION SURGERY     Social History:  reports that she has been smoking cigarettes. She has been smoking an average of .5 packs per day. She does not have any smokeless tobacco history on file. She reports current alcohol use. She reports that she does not use drugs.  No Known Allergies  History reviewed. No pertinent family history.  Prior to Admission medications   Medication Sig Start Date End Date Taking? Authorizing Provider  acetaminophen (TYLENOL) 500 MG tablet Take 1 tablet (500 mg total) by mouth every 6 (six) hours as needed. 12/22/15   Gloriann Loan, PA-C  acyclovir (ZOVIRAX) 400 MG tablet Take 1 tablet (400 mg total) by mouth 4 (four) times daily. Patient not taking: Reported on 07/22/2015 02/12/15   Jeannett Senior, PA-C  amLODipine (NORVASC) 5 MG tablet Take 1 tablet (5 mg total) by mouth daily. 03/06/22   Myna Bright M, PA-C  amLODipine (NORVASC) 5 MG tablet Take 1 tablet (5 mg total) by mouth daily. 03/06/22   Myna Bright M, PA-C  benzonatate (TESSALON) 100 MG capsule Take 1-2 capsules (100-200 mg total) by mouth 3 (three) times daily as needed for cough. 07/29/17  Antonietta Breach, PA-C  cephALEXin (KEFLEX) 500 MG capsule Take 1 capsule (500 mg total) by mouth 4 (four) times daily. 07/22/15   Hoyle Sauer, MD  chlorpheniramine-HYDROcodone (TUSSIONEX PENNKINETIC ER) 10-8 MG/5ML SUER Take 5 mLs by mouth at bedtime as needed for cough. 12/22/15   Gloriann Loan, PA-C  doxycycline (VIBRAMYCIN) 100 MG capsule Take 1 capsule (100 mg total) by  mouth 2 (two) times daily. 07/22/15   Hoyle Sauer, MD  guaifenesin (COUGH SYRUP) 100 MG/5ML syrup Take 200 mg by mouth 3 (three) times daily as needed for cough.    [provider]  HYDROcodone-acetaminophen (NORCO) 5-325 MG per tablet Take 1 tablet by mouth every 6 (six) hours as needed for moderate pain. Patient not taking: Reported on 07/22/2015 02/12/15   Jeannett Senior, PA-C  hydrocortisone cream 1 % Apply to affected area 2 times daily Patient not taking: Reported on 07/22/2015 02/12/15   Jeannett Senior, PA-C  ibuprofen (ADVIL,MOTRIN) 800 MG tablet Take 1 tablet (800 mg total) by mouth every 8 (eight) hours as needed for moderate pain. Patient not taking: Reported on 07/22/2015 03/18/15   Domenic Moras, PA-C  lisinopril-hydrochlorothiazide (ZESTORETIC) 10-12.5 MG per tablet Take 1 tablet by mouth daily. Patient not taking: Reported on 07/22/2015 12/28/14   Patel-Mills, Orvil Feil, PA-C  loratadine (CLARITIN) 10 MG tablet Take 1 tablet (10 mg total) by mouth daily. 07/29/17   Antonietta Breach, PA-C  meloxicam (MOBIC) 7.5 MG tablet Take 1 tablet (7.5 mg total) by mouth daily. 02/16/16   Clayton Bibles, PA-C  methocarbamol (ROBAXIN) 500 MG tablet Take 1 tablet (500 mg total) by mouth 2 (two) times daily. Patient not taking: Reported on 07/22/2015 03/18/15   Domenic Moras, PA-C  ondansetron (ZOFRAN) 4 MG tablet Take 1 tablet (4 mg total) by mouth every 6 (six) hours. 07/22/15   Hoyle Sauer, MD  predniSONE (DELTASONE) 20 MG tablet Take 2 tablets (40 mg total) by mouth daily. 07/22/15   Hoyle Sauer, MD  sodium chloride (OCEAN) 0.65 % SOLN nasal spray Place 1 spray into both nostrils as needed for congestion. 07/29/17   Antonietta Breach, PA-C    Physical Exam: Vitals:   12/20/22 0600 12/20/22 0620 12/20/22 0630 12/20/22 0645  BP: (!) 186/94 (!) 206/102 (!) 187/94   Pulse: 85 74 79 77  Resp: 18 (!) 26 17 (!) 27  Temp:      SpO2: 100% 100% 100% 100%  Height:        Constitutional:  Middle-aged female who appears to be in no acute distress Eyes: PERRL, lids and conjunctivae normal ENMT: Mucous membranes are moist.  Fair dentition Neck: normal, supple Respiratory: clear to auscultation bilaterally, no wheezing, no crackles. Normal respiratory effort. No accessory muscle use.  Cardiovascular: Regular rate and rhythm, no murmurs / rubs / gallops. No extremity edema. 2+ pedal pulses. No carotid bruits.  Abdomen: no tenderness, no masses palpated. No hepatosplenomegaly. Bowel sounds positive.  Musculoskeletal: no clubbing / cyanosis. No joint deformity upper and lower extremities. Good ROM, no contractures. Normal muscle tone.  Skin: no rashes, lesions, ulcers. No induration Neurologic: CN 2-12 grossly intact. Sensation intact, DTR normal.  Strength 4/5 in the left upper and lower extremity.  Strength 5/5 on the right. Psychiatric: Normal judgment and insight. Alert and oriented x 3. Normal mood.   Data Reviewed:  EKG revealed normal sinus rhythm at 95 bpm with biatrial enlargement.  Assessment and Plan: CVA Acute/subacute.  Patient presented with a 2-day history of left-sided numbness and weakness.  CT head noted concern for stroke for which MRI revealed scattered diffusion restriction and cytotoxic edema in several areas of the right PCA territory with possible involvement of the right hippocampus and advanced chronic small vessel disease bilaterally.  Patient was not a candidate for tPA due to being out of the therapeutic window.  Patient has been started on aspirin and Plavix.  Risk factors for stroke include recent cocaine use and uncontrolled hypertension.  Triglyceride levels 179. -Admit to a progressive bed -Stroke order set utilized -Neurochecks -Check lipid panel and hemoglobin A1c -Check CTA of the head and neck -Check echocardiogram -Continue aspirin and Plavix -PT/OT/speech to evaluate and treat -Appreciate neurology consultative services we will follow-up for  any further recommendations  Hypertensive urgency Acute.  Blood pressures initially elevated up to 220/106.  Patient admitted to poor compliance with blood pressure medications.  Prior blood pressure regimen had included amlodipine 5 mg daily.  Unclear if patient's blood pressures are partially elevated due to recent cocaine use. -Increase amlodipine to 10 mg daily -Consider adding second agent in a.m. 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   -Needs to be referred to oral surgery in the outpatient setting  Hypertriglyceridemia Acute.  Patient triglyceride levels were noted to be elevated at 179. -Start fenofibrate 54  Cocaine abuse UDS positive for cocaine. -Counseled patient on need of cessation of cocaine use.  Advance Care Planning:   Code Status: Full Code DVT prophylaxis: Lovenox Consults: Neurology  Family Communication: Family updated at bedside  Severity of Illness: The appropriate patient status for this patient is INPATIENT. Inpatient status is judged to be reasonable and necessary in order to provide the required intensity of service to ensure the patient's safety. The patient's presenting symptoms, physical exam findings, and initial radiographic and laboratory data in the context of their chronic comorbidities is felt to place them at high risk for further clinical deterioration. Furthermore, it is not anticipated that the patient will be medically stable for discharge from the hospital within 2 midnights of admission.   * I certify that at the point of admission it is my clinical judgment that the patient will require inpatient hospital care spanning beyond 2 midnights from the point of admission due to high intensity of service, high risk for further deterioration and high frequency of surveillance required.*  Author: Norval Morton, MD 12/20/2022 7:47  AM  For on call review [email or web address].

## 2022-12-20 NOTE — ED Provider Notes (Addendum)
[facility] Provider Note   CSN: [id number] Arrival date & time: 12/20/22  0518     History  Chief Complaint  Patient presents with   Weakness   Stroke Symptoms    Ashlee Carlson is a 62 y.o. female.  HPI     This is a 62 year old female who presents with concerns for weakness.  She works here in [employer].  Patient reports that Monday overnight she developed tingling and numbness in her left face, hand, and left foot.  She states that she believes that she also was weak in her left leg.  However, she was distracted by work and continue to work.  This morning she felt like her weakness was worse and she had difficulty walking.  She has also been dropping things with her left hand.  She has a hard time recalling exactly when her symptoms worsened and/or her last normal but does state that she believes her symptoms began on Monday overnight.  She reports that she has had some mild frontal headache over the last several weeks.  She has a history of hypertension but is noncompliant with her medications.  Home Medications Prior to Admission medications   Medication Sig Start Date End Date Taking? Authorizing Provider  acetaminophen (TYLENOL) 500 MG tablet Take 1 tablet (500 mg total) by mouth every 6 (six) hours as needed. 12/22/15   Gloriann Loan, PA-C  acyclovir (ZOVIRAX) 400 MG tablet Take 1 tablet (400 mg total) by mouth 4 (four) times daily. Patient not taking: Reported on 07/22/2015 02/12/15   Jeannett Senior, PA-C  amLODipine (NORVASC) 5 MG tablet Take 1 tablet (5 mg total) by mouth daily. 03/06/22   Myna Bright M, PA-C  amLODipine (NORVASC) 5 MG tablet Take 1 tablet (5 mg total) by mouth daily. 03/06/22   Myna Bright M, PA-C  benzonatate (TESSALON) 100 MG capsule Take 1-2 capsules (100-200 mg total) by mouth 3 (three) times daily as needed for cough. 07/29/17   Antonietta Breach, PA-C  cephALEXin (KEFLEX) 500 MG capsule  Take 1 capsule (500 mg total) by mouth 4 (four) times daily. 07/22/15   Hoyle Sauer, MD  chlorpheniramine-HYDROcodone (TUSSIONEX PENNKINETIC ER) 10-8 MG/5ML SUER Take 5 mLs by mouth at bedtime as needed for cough. 12/22/15   Gloriann Loan, PA-C  doxycycline (VIBRAMYCIN) 100 MG capsule Take 1 capsule (100 mg total) by mouth 2 (two) times daily. 07/22/15   Hoyle Sauer, MD  guaifenesin (COUGH SYRUP) 100 MG/5ML syrup Take 200 mg by mouth 3 (three) times daily as needed for cough.    [provider]  HYDROcodone-acetaminophen (NORCO) 5-325 MG per tablet Take 1 tablet by mouth every 6 (six) hours as needed for moderate pain. Patient not taking: Reported on 07/22/2015 02/12/15   Jeannett Senior, PA-C  hydrocortisone cream 1 % Apply to affected area 2 times daily Patient not taking: Reported on 07/22/2015 02/12/15   Jeannett Senior, PA-C  ibuprofen (ADVIL,MOTRIN) 800 MG tablet Take 1 tablet (800 mg total) by mouth every 8 (eight) hours as needed for moderate pain. Patient not taking: Reported on 07/22/2015 03/18/15   Domenic Moras, PA-C  lisinopril-hydrochlorothiazide (ZESTORETIC) 10-12.5 MG per tablet Take 1 tablet by mouth daily. Patient not taking: Reported on 07/22/2015 12/28/14   Patel-Mills, Orvil Feil, PA-C  loratadine (CLARITIN) 10 MG tablet Take 1 tablet (10 mg total) by mouth daily. 07/29/17   Antonietta Breach, PA-C  meloxicam (MOBIC) 7.5 MG tablet Take 1 tablet (7.5 mg total) by  mouth daily. 02/16/16   Clayton Bibles, PA-C  methocarbamol (ROBAXIN) 500 MG tablet Take 1 tablet (500 mg total) by mouth 2 (two) times daily. Patient not taking: Reported on 07/22/2015 03/18/15   Domenic Moras, PA-C  ondansetron (ZOFRAN) 4 MG tablet Take 1 tablet (4 mg total) by mouth every 6 (six) hours. 07/22/15   Hoyle Sauer, MD  predniSONE (DELTASONE) 20 MG tablet Take 2 tablets (40 mg total) by mouth daily. 07/22/15   Hoyle Sauer, MD  sodium chloride (OCEAN) 0.65 % SOLN nasal spray Place 1 spray into both  nostrils as needed for congestion. 07/29/17   Antonietta Breach, PA-C      Allergies    Patient has no known allergies.    Review of Systems   Review of Systems  Constitutional:  Negative for fever.  Respiratory:  Negative for shortness of breath.   Cardiovascular:  Negative for chest pain.  Gastrointestinal:  Negative for abdominal pain, nausea and vomiting.  Neurological:  Positive for weakness, numbness and headaches.  All other systems reviewed and are negative.   Physical Exam Updated Vital Signs BP (!) 186/94   Pulse 85   Temp 98 F (36.7 C)   Resp 18   Ht 1.651 m (5\' 5" )   SpO2 100%   BMI 35.28 kg/m  Physical Exam Vitals and nursing note reviewed.  Constitutional:      Appearance: She is well-developed. She is obese. She is not ill-appearing.  HENT:     Head: Normocephalic and atraumatic.     Mouth/Throat:     Mouth: Mucous membranes are moist.  Eyes:     Pupils: Pupils are equal, round, and reactive to light.  Cardiovascular:     Rate and Rhythm: Normal rate and regular rhythm.     Heart sounds: Normal heart sounds.  Pulmonary:     Effort: Pulmonary effort is normal. No respiratory distress.     Breath sounds: No wheezing.  Abdominal:     Palpations: Abdomen is soft.  Musculoskeletal:     Cervical back: Neck supple.  Skin:    General: Skin is warm and dry.  Neurological:     Mental Status: She is alert and oriented to person, place, and time.     Comments: Cranial nerves II through XII intact, patient with very subtle left grip weakness but intact biceps, triceps, deltoid, she has some left leg drift with 4+ out of 5 strength, finger-nose-finger intact, gait testing deferred  Psychiatric:     Comments: Tearful and anxious appearing.     ED Results / Procedures / Treatments   Labs (all labs ordered are listed, but only abnormal results are displayed) Labs Reviewed  COMPREHENSIVE METABOLIC PANEL - Abnormal; Notable for the following components:       Result Value   Creatinine, Ser 1.12 (*)    GFR, Estimated 56 (*)    All other components within normal limits  I-STAT CHEM 8, ED - Abnormal; Notable for the following components:   Potassium 3.2 (*)    BUN 21 (*)    Creatinine, Ser 1.10 (*)    All other components within normal limits  PROTIME-INR  APTT  CBC  DIFFERENTIAL  ETHANOL  CBG MONITORING, ED  I-STAT BETA HCG BLOOD, ED ([facility], [facility], [facility] ONLY)    EKG EKG Interpretation  Date/Time:  Thursday December 20 2022 05:32:12 EDT Ventricular Rate:  93 PR Interval:  128 QRS Duration: 72 QT Interval:  368 QTC Calculation: 457 R Axis:  49 Text Interpretation: Normal sinus rhythm Biatrial enlargement Cannot rule out Anterior infarct , age undetermined Abnormal ECG When compared with ECG of 06-Mar-2022 07:42, PREVIOUS ECG IS PRESENT Confirmed by Thayer Jew [phone]) on 12/20/2022 6:17:33 AM  Radiology CT HEAD WO CONTRAST  Result Date: 12/20/2022 CLINICAL DATA:  62 year old female with weakness of left face, hand and leg beginning several nights ago. Headache. EXAM: CT HEAD WITHOUT CONTRAST TECHNIQUE: Contiguous axial images were obtained from the base of the skull through the vertex without intravenous contrast. RADIATION DOSE REDUCTION: This exam was performed according to the departmental dose-optimization program which includes automated exposure control, adjustment of the mA and/or kV according to patient size and/or use of iterative reconstruction technique. COMPARISON:  Head CT 01/05/2015. FINDINGS: Brain: Cerebral volume remains normal for age. But Patchy and confluent bilateral cerebral white matter hypodensity and moderate heterogeneity throughout the bilateral deep gray nuclei has progressed since 2016. Asymmetric involvement is greater on the right side including at the caudate (series 3, image 17) and thalamus (image 15). Furthermore, there is a small area of cortical hypodensity in the right occipital pole on series 13 which is new  seems new or increased. No superimposed midline shift, ventriculomegaly, mass effect, evidence of mass lesion, intracranial hemorrhage. No other acute cortically based infarct identified. Vascular: Calcified atherosclerosis at the skull base. No suspicious intracranial vascular hyperdensity. Skull: No acute osseous abnormality identified. Sinuses/Orbits: Left maxillary sinus periosteal thickening, but opacification of that sinus has resolved since 2016. Other Visualized paranasal sinuses and mastoids are stable and well aerated. Other: Slight leftward gaze. No other acute orbit or scalp soft tissue finding. IMPRESSION: 1. Positive for Age Indeterminate Ischemic changes in the brain, mostly small vessel type including extensive involvement of the deep gray nuclei. But also difficult to exclude a small acute or subacute occipital pole Right PCA territory infarct. 2. No acute intracranial hemorrhage or mass effect. Electronically Signed   By: Genevie Ann M.D.   On: 12/20/2022 06:05    Procedures Procedures    Medications Ordered in ED Medications  sodium chloride flush (NS) 0.9 % injection 3 mL (3 mLs Intravenous Given 12/20/22 0604)  labetalol (NORMODYNE) injection 20 mg (20 mg Intravenous Given 12/20/22 0604)  amLODipine (NORVASC) tablet 5 mg (5 mg Oral Given 12/20/22 0609)    ED Course/ Medical Decision Making/ A&P                             Medical Decision Making Amount and/or Complexity of Data Reviewed Labs: ordered. Radiology: ordered.  Risk Prescription drug management. Decision regarding hospitalization.   This patient presents to the ED for concern of strokelike symptoms, this involves an extensive number of treatment options, and is a complaint that carries with it a high risk of complications and morbidity.  I considered the following differential and admission for this acute, potentially life threatening condition.  The differential diagnosis includes acute stroke, PRESS, hypertensive  urgency, hypertensive emergency  MDM:    This is a 62 year old female who presents with concerns for strokelike symptoms.  Initial symptoms started over 24 hours ago.  She is out of the tPA window.  She does have some subtle left grip strength weakness as well as some left-sided drift and numbness.  Stroke workup initiated.  CT scan reviewed and is concerning for possible subacute right PCA infarct.  This would correlate with her symptoms.  Initial lab work is reassuring.  She was  significantly hypertensive on initial arrival.  She was given a dose of labetalol and her home Norvasc.  Blood pressure now 186/94.  Will not be too aggressive in blood pressure management to allow for permissive hypertension.  Neurology consulted.  Will plan for admission and MRI of the brain.  (Labs, imaging, consults)  Labs: I Ordered, and personally interpreted labs.  The pertinent results include: CBC, BMP, PT/INR  Imaging Studies ordered: I ordered imaging studies including CT head I independently visualized and interpreted imaging. I agree with the radiologist interpretation  Additional history obtained from chart review.  External records from outside source obtained and reviewed including prior evaluations  Cardiac Monitoring: The patient was maintained on a cardiac monitor.  If on the cardiac monitor, I personally viewed and interpreted the cardiac monitored which showed an underlying rhythm of: SInus rhythm  Reevaluation: After the interventions noted above, I reevaluated the patient and found that they have :stayed the same  Social Determinants of Health:  lives independently  Disposition: Discharge  Co morbidities that complicate the patient evaluation  Past Medical History:  Diagnosis Date   Hypertension    Shingles      Medicines Meds ordered this encounter  Medications   sodium chloride flush (NS) 0.9 % injection 3 mL   labetalol (NORMODYNE) injection 20 mg   amLODipine (NORVASC)  tablet 5 mg    I have reviewed the patients home medicines and have made adjustments as needed  Problem List / ED Course: Problem List Items Addressed This Visit   None Visit Diagnoses     Weakness    -  Primary   Cerebrovascular accident (CVA), unspecified mechanism ([place])       Relevant Medications   labetalol (NORMODYNE) injection 20 mg (Completed)   amLODipine (NORVASC) tablet 5 mg (Completed)                   Final Clinical Impression(s) / ED Diagnoses Final diagnoses:  Weakness  Cerebrovascular accident (CVA), unspecified mechanism [facility])    Rx / DC Orders ED Discharge Orders     None         Maudie Shingledecker, Barbette Hair, MD 12/20/22 [id number]    Merryl Hacker, MD 12/20/22 0630

## 2022-12-20 NOTE — Progress Notes (Signed)
Bilateral lower extremity venous study completed.   Preliminary results relayed to RN.  Please see CV Procedures for preliminary results.  Liesa Tsan, RVT  10:23 AM 12/20/22

## 2022-12-20 NOTE — ED Notes (Signed)
Took over pt care at this time. At this time pt is still in MRI

## 2022-12-20 NOTE — ED Triage Notes (Signed)
Patient reports new sx of weakness in the left side of her face, her hand and her leg since Monday night at 2200.  She has had a headache on the right frontal area that has been present for 2 weeks.  She also reports she has had a tightness in her throat with some difficulty swallowing and coughing for 3 weeks.  She is alert and oriented.  She is able to ambulate.  She has been dropping things with her left hand.  Patient is tearful.  She admits to non compliance with her bp medications

## 2022-12-20 NOTE — ED Notes (Signed)
Pt arrived to [facility] at this time via wheelchair and ED tech. Pt from triage and has been to CT at this time

## 2022-12-20 NOTE — Progress Notes (Signed)
Mobility Specialist Progress Note   12/20/22 1400  Mobility  Activity Ambulated with assistance in hallway  Level of Assistance Contact guard assist, steadying assist  Assistive Device Other (Comment) (IV pole)  Distance Ambulated (ft) 240 ft  Activity Response Tolerated well  Mobility Referral Yes  $Mobility charge 1 Mobility   Post Mobility: 89 HR, 166/73 BP, 100% SpO2 on RA   Received in bed setting off bed alarm attempting to go to BR. Assisted pt to BR w/ minG where pt had a successful void. Prior to session, no reported pain but expressing continuous numb feeling in L sided extremities + limited ROM. Able to ambulate in hallway w/ no LOB and a steady gait. No physical assist throughout session, returned back to bed w/o fault, call bell in reach and bed alarm on.     Holland Falling Mobility Specialist Please contact via SecureChat or  Rehab office at [phone]

## 2022-12-20 NOTE — Progress Notes (Signed)
   12/20/22 1149  Spiritual Encounters  Type of Visit Initial  Care provided to: Pt and family  Referral source Patient request  Reason for visit Advance directives  OnCall Visit No   PT request Spiritual Consult for ACD.  Chaplain brought ACD forms and provided education along with briefing them on the notary process.  Encouraged them to fill out forms today and page spiritual care when they are ready for notary.

## 2022-12-20 NOTE — ED Notes (Signed)
Patient placed onto cardiac monitor. RN is at bedside at this time.

## 2022-12-20 NOTE — Consult Note (Signed)
Neurology Consultation Reason for Consult: Stroke Referring Physician: Horton, C  CC: Stroke  History is obtained from: Patient  HPI: Ashlee Carlson is a 62 y.o. female with a history of hypertension who presents with left-sided numbness that started abruptly Monday evening.  She states that is been essentially the same since it started, not getting worse and not getting better.  She had hoped it would go away, but due to not going away she sought care in the emergency department last night where CT reveals several subacute appearing areas on CT including the right occipital one.   LKW: The evening tnk given?: no, outside of window   Past Medical History:  Diagnosis Date   Hypertension    Shingles      History reviewed. No pertinent family history.   Social History:  reports that she has been smoking cigarettes. She has been smoking an average of .5 packs per day. She does not have any smokeless tobacco history on file. She reports current alcohol use. She reports that she does not use drugs.   Exam: Current vital signs: BP (!) 186/94   Pulse 85   Temp 98 F (36.7 C)   Resp 18   Ht 5\' 5"  (1.651 m)   SpO2 100%   BMI 35.28 kg/m  Vital signs in last 24 hours: Temp:  [98 F (36.7 C)] 98 F (36.7 C) (03/21 0527) Pulse Rate:  [85-95] 85 (03/21 0600) Resp:  [18-20] 18 (03/21 0600) BP: (186-220)/(94-150) 186/94 (03/21 0600) SpO2:  [100 %] 100 % (03/21 0600)   Physical Exam  Appears well-developed and well-nourished.   Neuro: Mental Status: Patient is awake, alert, oriented to person, place, month, year, and situation. Patient is able to give a clear and coherent history. No signs of aphasia or neglect Cranial Nerves: II:?  Partial left upper quadrant field cut.  Pupils are equal, round, and reactive to light.   III,IV, VI: EOMI without ptosis or diploplia.  V: Facial sensation is diminished on the left VII: Facial movement with mild left-sided weakness VIII:  hearing is intact to voice X: Uvula elevates symmetrically XI: Shoulder shrug is symmetric. XII: tongue is midline without atrophy or fasciculations.  Motor: Tone is normal. Bulk is normal. 5/5 strength was present on the right, she has decreased grip strength on the left, good strength in bilateral legs Sensory: Sensation is diminished throughout the left side Cerebellar: No clear ataxia     I have reviewed labs in epic and the results pertinent to this consultation are: Creatinine 1.1  I have reviewed the images obtained: CT head-multiple age-indeterminate subcortical strokes as well as a subacute appearing right parieto-occipital stroke.  Impression: 62 year old female with left-sided numbness and weakness 2 days duration most consistent with acute ischemic stroke.  My suspicion is that she may have multiple given the subacute appearing stroke on CT would be unusual to cause weakness.  She will need MRI as well as secondary stroke risk factor modification.  Recommendations: - HgbA1c, fasting lipid panel - MRI of the brain without contrast - Frequent neuro checks - Echocardiogram - CTA head and neck - Prophylactic therapy-Antiplatelet med: Aspirin - dose 81mg  and plavix 75mg  daily  after 300mg  load  - Risk factor modification - Telemetry monitoring - PT consult, OT consult, Speech consult - Stroke team to follow    Roland Rack, MD [facility] [phone]  If 7pm- 7am, please page neurology on call as listed in [place].

## 2022-12-20 NOTE — ED Notes (Signed)
ED TO INPATIENT HANDOFF REPORT  ED Nurse Name and Phone #: Ivin Poot Name/Age/Gender Ashlee Carlson 62 y.o. female Room/Bed: 038C/038C  Code Status   Code Status: Full Code  Home/SNF/Other Home Patient oriented to: self, place, time, and situation Is this baseline? Yes   Triage Complete: Triage complete  Chief Complaint CVA (cerebral vascular accident) [facility]) [I63.9]  Triage Note Patient reports new sx of weakness in the left side of her face, her hand and her leg since Monday night at 2200.  She has had a headache on the right frontal area that has been present for 2 weeks.  She also reports she has had a tightness in her throat with some difficulty swallowing and coughing for 3 weeks.  She is alert and oriented.  She is able to ambulate.  She has been dropping things with her left hand.  Patient is tearful.  She admits to non compliance with her bp medications   Allergies No Known Allergies  Level of Care/Admitting Diagnosis ED Disposition     ED Disposition  Admit   Condition  --   [place]: [facility] [[id number]]  Level of Care: Progressive [102]  Admit to Progressive based on following criteria: NEUROLOGICAL AND NEUROSURGICAL complex patients with significant risk of instability, who do not meet ICU criteria, yet require close observation or frequent assessment (< / = every 2 - 4 hours) with medical / nursing intervention.  May admit patient to [facility] or [facility] if equivalent level of care is available:: No  Covid Evaluation: Asymptomatic - no recent exposure (last 10 days) testing not required  Diagnosis: CVA (cerebral vascular accident) [facility]) [id number]  Admitting Physician: Kayleen Memos [id number]  Attending Physician: Kayleen Memos [id number]  Certification:: I certify this patient will need inpatient services for at least 2 midnights  Estimated Length of Stay: 2          B Medical/Surgery History Past  Medical History:  Diagnosis Date   Hypertension    Shingles    Past Surgical History:  Procedure Laterality Date   BREAST REDUCTION SURGERY       A IV Location/Drains/Wounds Patient Lines/Drains/Airways Status     Active Line/Drains/Airways     Name Placement date Placement time Site Days   Peripheral IV 12/20/22 20 G Anterior;Left;Proximal Forearm 12/20/22  0559  Forearm  less than 1            Intake/Output Last 24 hours No intake or output data in the 24 hours ending 12/20/22 [id number]  Labs/Imaging Results for orders placed or performed during the hospital encounter of 12/20/22 (from the past 48 hour(s))  Protime-INR     Status: None   Collection Time: 12/20/22  5:35 AM  Result Value Ref Range   Prothrombin Time 12.6 11.4 - 15.2 seconds   INR 1.0 0.8 - 1.2    Comment: (NOTE) INR goal varies based on device and disease states. Performed at [facility], [place] [address] [place], [place] [address]   APTT     Status: None   Collection Time: 12/20/22  5:35 AM  Result Value Ref Range   aPTT 33 24 - 36 seconds    Comment: Performed at [facility] [address], [place],  [address]  CBC     Status: None   Collection Time: 12/20/22  5:35 AM  Result Value Ref Range   WBC 7.3 4.0 -  10.5 K/uL   RBC 4.19 3.87 - 5.11 MIL/uL   Hemoglobin 12.8 12.0 - 15.0 g/dL   HCT 38.9 36.0 - 46.0 %   MCV 92.8 80.0 - 100.0 fL   MCH 30.5 26.0 - 34.0 pg   MCHC 32.9 30.0 - 36.0 g/dL   RDW 13.2 11.5 - 15.5 %   Platelets 300 150 - 400 K/uL   nRBC 0.0 0.0 - 0.2 %    Comment: Performed at [facility], [place] [address], [place], [place] [address]  Differential     Status: None   Collection Time: 12/20/22  5:35 AM  Result Value Ref Range   Neutrophils Relative % 48 %   Neutro Abs 3.5 1.7 - 7.7 K/uL   Lymphocytes Relative 37 %   Lymphs Abs 2.7 0.7 - 4.0 K/uL   Monocytes Relative 12 %   Monocytes Absolute 0.9 0.1 - 1.0 K/uL   Eosinophils Relative 2 %   Eosinophils  Absolute 0.1 0.0 - 0.5 K/uL   Basophils Relative 1 %   Basophils Absolute 0.1 0.0 - 0.1 K/uL   Immature Granulocytes 0 %   Abs Immature Granulocytes 0.03 0.00 - 0.07 K/uL    Comment: Performed at [facility], [place] [address], [place], [place] [address]  Comprehensive metabolic panel     Status: Abnormal   Collection Time: 12/20/22  5:35 AM  Result Value Ref Range   Sodium 138 135 - 145 mmol/L   Potassium 3.5 3.5 - 5.1 mmol/L    Comment: HEMOLYSIS AT THIS LEVEL MAY AFFECT RESULT   Chloride 103 98 - 111 mmol/L   CO2 26 22 - 32 mmol/L   Glucose, Bld 88 70 - 99 mg/dL    Comment: Glucose reference range applies only to samples taken after fasting for at least 8 hours.   BUN 17 6 - 20 mg/dL   Creatinine, Ser 1.12 (H) 0.44 - 1.00 mg/dL   Calcium 9.3 8.9 - 10.3 mg/dL   Total Protein 7.0 6.5 - 8.1 g/dL   Albumin 3.8 3.5 - 5.0 g/dL   AST 29 15 - 41 U/L    Comment: HEMOLYSIS AT THIS LEVEL MAY AFFECT RESULT   ALT 15 0 - 44 U/L    Comment: HEMOLYSIS AT THIS LEVEL MAY AFFECT RESULT   Alkaline Phosphatase 60 38 - 126 U/L   Total Bilirubin 0.9 0.3 - 1.2 mg/dL    Comment: HEMOLYSIS AT THIS LEVEL MAY AFFECT RESULT   GFR, Estimated 56 (L) >60 mL/min    Comment: (NOTE) Calculated using the CKD-EPI Creatinine Equation (2021)    Anion gap 9 5 - 15    Comment: Performed at [facility], [place] [address], [place], [place] [address]  Ethanol     Status: None   Collection Time: 12/20/22  5:35 AM  Result Value Ref Range   Alcohol, Ethyl (B) <10 <10 mg/dL    Comment: (NOTE) Lowest detectable limit for serum alcohol is 10 mg/dL.  For medical purposes only. Performed at [facility], [place] [address], [place], [place] [address]   CBG monitoring, ED     Status: None   Collection Time: 12/20/22  5:38 AM  Result Value Ref Range   Glucose-Capillary 83 70 - 99 mg/dL    Comment: Glucose reference range applies only to samples taken after fasting for at least 8 hours.  I-Stat beta hCG  blood, ED     Status: None   Collection  Time: 12/20/22  5:43 AM  Result Value Ref Range   I-stat hCG, quantitative <5.0 <5 mIU/mL   Comment 3            Comment:   GEST. AGE      CONC.  (mIU/mL)   <=1 WEEK        5 - 50     2 WEEKS       50 - 500     3 WEEKS       100 - 10,000     4 WEEKS     1,000 - 30,000        FEMALE AND NON-PREGNANT FEMALE:     LESS THAN 5 mIU/mL   I-stat chem 8, ED     Status: Abnormal   Collection Time: 12/20/22  5:45 AM  Result Value Ref Range   Sodium 141 135 - 145 mmol/L   Potassium 3.2 (L) 3.5 - 5.1 mmol/L   Chloride 103 98 - 111 mmol/L   BUN 21 (H) 6 - 20 mg/dL   Creatinine, Ser 1.10 (H) 0.44 - 1.00 mg/dL   Glucose, Bld 82 70 - 99 mg/dL    Comment: Glucose reference range applies only to samples taken after fasting for at least 8 hours.   Calcium, Ion 1.19 1.15 - 1.40 mmol/L   TCO2 27 22 - 32 mmol/L   Hemoglobin 13.6 12.0 - 15.0 g/dL   HCT 40.0 36.0 - 46.0 %   MR BRAIN WO CONTRAST  Result Date: 12/20/2022 CLINICAL DATA:  62 year old female with weakness of left face, hand and leg beginning several nights ago. Headache. EXAM: MRI HEAD WITHOUT CONTRAST TECHNIQUE: Multiplanar, multiecho pulse sequences of the brain and surrounding structures were obtained without intravenous contrast. COMPARISON:  Head CT 0547 hours today. FINDINGS: Brain: Patchy multifocal diffusion restriction in the right PCA territory: - occipital pole series 5, image 61 - central right thalamus image 71 -but also throughout the right hippocampal formation (series 5, image 66) as well as punctate involvement of nearby right periatrial white matter. These areas demonstrate 2 2/FLAIR hyperintense cytotoxic edema. No hemorrhagic transformation or mass effect. No other diffusion restriction. But chronic lacunar infarcts in the bilateral other deep gray nuclei, and patchy/confluent T2 and FLAIR hyperintensity in the bilateral corona radiata. Similar T2 heterogeneity in the pons. No cortical  encephalomalacia or chronic cerebral blood products identified. Cerebellum appears spared. No midline shift, mass effect, evidence of mass lesion, ventriculomegaly, extra-axial collection or acute intracranial hemorrhage. Cervicomedullary junction and pituitary are within normal limits. Vascular: Major intracranial vascular flow voids are preserved, distal left vertebral artery appears to be dominant. Skull and upper cervical spine: Negative. Visualized bone marrow signal is within normal limits. Sinuses/Orbits: Rightward gaze, otherwise negative. Other: Grossly normal visible internal auditory structures. Mastoids are clear. Visible scalp and face appear negative. IMPRESSION: 1. Scattered diffusion restriction and cytotoxic edema in several areas of the Right PCA territory (including thalamus, occipital pole). Conspicuous involvement of the right hippocampus, but favor acute to subacute ischemia in light of underlying advanced small vessel disease (#3) rather than seizure/status epilepticus or drug related excitotoxicity. 2. No associated hemorrhage or mass effect. 3. 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 Electronically Signed   By: Genevie Ann M.D.   On: 12/20/2022 07:38   CT HEAD WO CONTRAST  Result Date: 12/20/2022 CLINICAL DATA:  62 year old female with weakness of left face,  hand and leg beginning several nights ago. Headache. EXAM: CT HEAD WITHOUT CONTRAST TECHNIQUE: Contiguous axial images were obtained from the base of the skull through the vertex without intravenous contrast. RADIATION DOSE REDUCTION: This exam was performed according to the departmental dose-optimization program which includes automated exposure control, adjustment of the mA and/or kV according to patient size and/or use of iterative reconstruction technique. COMPARISON:  Head CT 01/05/2015. FINDINGS: Brain: Cerebral volume remains  normal for age. But Patchy and confluent bilateral cerebral white matter hypodensity and moderate heterogeneity throughout the bilateral deep gray nuclei has progressed since 2016. Asymmetric involvement is greater on the right side including at the caudate (series 3, image 17) and thalamus (image 15). Furthermore, there is a small area of cortical hypodensity in the right occipital pole on series 13 which is new seems new or increased. No superimposed midline shift, ventriculomegaly, mass effect, evidence of mass lesion, intracranial hemorrhage. No other acute cortically based infarct identified. Vascular: Calcified atherosclerosis at the skull base. No suspicious intracranial vascular hyperdensity. Skull: No acute osseous abnormality identified. Sinuses/Orbits: Left maxillary sinus periosteal thickening, but opacification of that sinus has resolved since 2016. Other Visualized paranasal sinuses and mastoids are stable and well aerated. Other: Slight leftward gaze. No other acute orbit or scalp soft tissue finding. IMPRESSION: 1. Positive for Age Indeterminate Ischemic changes in the brain, mostly small vessel type including extensive involvement of the deep gray nuclei. But also difficult to exclude a small acute or subacute occipital pole Right PCA territory infarct. 2. No acute intracranial hemorrhage or mass effect. Electronically Signed   By: Genevie Ann M.D.   On: 12/20/2022 06:05    Pending Labs Unresulted Labs (From admission, onward)     Start     Ordered   12/20/22 0815  Rapid urine drug screen (hospital performed)  ONCE - STAT,   STAT        12/20/22 0814   12/20/22 0757  HIV Antibody (routine testing w rflx)  (HIV Antibody (Routine testing w reflex) panel)  Once,   R        12/20/22 0758   12/20/22 0757  Lipid panel  (Labs)  Once,   R       Comments: Fasting    12/20/22 0758   12/20/22 0757  Hemoglobin A1c  (Labs)  Once,   R       Comments: To assess prior glycemic control    12/20/22 0758             Vitals/Pain Today's Vitals   12/20/22 0740 12/20/22 0742 12/20/22 0745 12/20/22 0808  BP: (!) 160/83   (!) 191/90  Pulse:   78 81  Resp: 20  (!) 22 (!) 25  Temp:    98.3 F (36.8 C)  SpO2:   99% 100%  Height:      PainSc:  0-No pain      Isolation Precautions No active isolations  Medications Medications  clopidogrel (PLAVIX) tablet 300 mg (300 mg Oral Given 12/20/22 0821)    And  clopidogrel (PLAVIX) tablet 75 mg (has no administration in time range)  aspirin EC tablet 81 mg (81 mg Oral Given 12/20/22 0821)   stroke: early stages of recovery book (has no administration in time range)  0.9 %  sodium chloride infusion ( Intravenous New Bag/Given 12/20/22 0824)  acetaminophen (TYLENOL) tablet 650 mg (has no administration in time range)    Or  acetaminophen (TYLENOL) 160 MG/5ML solution 650 mg (has no  administration in time range)    Or  acetaminophen (TYLENOL) suppository 650 mg (has no administration in time range)  senna-docusate (Senokot-S) tablet 1 tablet (has no administration in time range)  enoxaparin (LOVENOX) injection 40 mg (has no administration in time range)  amLODipine (NORVASC) tablet 10 mg (has no administration in time range)  hydrALAZINE (APRESOLINE) injection 10 mg (has no administration in time range)  sodium chloride flush (NS) 0.9 % injection 3 mL (3 mLs Intravenous Given 12/20/22 0604)  labetalol (NORMODYNE) injection 20 mg (20 mg Intravenous Given 12/20/22 0604)  amLODipine (NORVASC) tablet 5 mg (5 mg Oral Given 12/20/22 0609)  amLODipine (NORVASC) tablet 5 mg (5 mg Oral Given 12/20/22 [id number])    Mobility walks     Focused Assessments Neuro Assessment Handoff:  Swallow screen pass? Yes  Cardiac Rhythm: Sinus bradycardia NIH Stroke Scale  Dizziness Present: No Headache Present: No Interval: Shift assessment Level of Consciousness (1a.)   : Alert, keenly responsive LOC Questions (1b. )   : Answers both questions correctly LOC  Commands (1c. )   : Performs both tasks correctly Best Gaze (2. )  : Normal Visual (3. )  : No visual loss Facial Palsy (4. )    : Normal symmetrical movements Motor Arm, Left (5a. )   : No drift Motor Arm, Right (5b. ) : No drift Motor Leg, Left (6a. )  : Drift Motor Leg, Right (6b. ) : No drift Limb Ataxia (7. ): Absent Sensory (8. )  : Mild-to-moderate sensory loss, patient feels pinprick is less sharp or is dull on the affected side, or there is a loss of superficial pain with pinprick, but patient is aware of being touched Best Language (9. )  : No aphasia Dysarthria (10. ): Normal Extinction/Inattention (11.)   : No Abnormality Complete NIHSS TOTAL: 2     Neuro Assessment: Exceptions to WDL Neuro Checks:   Initial (12/20/22 [id number])  Has TPA been given? No If patient is a Neuro Trauma and patient is going to OR before floor call report to [place] nurse: [phone] or [phone]   R Recommendations: See Admitting Provider Note  Report given to:   Additional Notes:

## 2022-12-20 NOTE — H&P (View-Only) (Signed)
STROKE TEAM PROGRESS NOTE   INTERVAL HISTORY No family at bedside.  Plan of care reviewed with patient. Stroke workup continuing.  Neuroexam stable.  Vitals:   12/20/22 1002 12/20/22 1205 12/20/22 1248 12/20/22 1330  BP: (!) 200/93 (!) 198/102 (!) 212/96 (!) 155/94  Pulse: 76 79 85 85  Resp: (!) 21 (!) 23 (!) 27 19  Temp: 98.2 F (36.8 C) 98.6 F (37 C)  98.6 F (37 C)  TempSrc: Oral     SpO2: 100% 99% 98% 100%  Height:       CBC:  Recent Labs  Lab 12/20/22 0535 12/20/22 0545  WBC 7.3  --   NEUTROABS 3.5  --   HGB 12.8 13.6  HCT 38.9 40.0  MCV 92.8  --   PLT 300  --    Basic Metabolic Panel:  Recent Labs  Lab 12/20/22 0535 12/20/22 0545  NA 138 141  K 3.5 3.2*  CL 103 103  CO2 26  --   GLUCOSE 88 82  BUN 17 21*  CREATININE 1.12* 1.10*  CALCIUM 9.3  --    Lipid Panel:  Recent Labs  Lab 12/20/22 0825  CHOL 170  TRIG 179*  HDL 65  CHOLHDL 2.6  VLDL 36  LDLCALC 69   HgbA1c: No results for input(s): "HGBA1C" in the last 168 hours. Urine Drug Screen:  Recent Labs  Lab 12/20/22 0954  LABOPIA NONE DETECTED  COCAINSCRNUR POSITIVE*  LABBENZ NONE DETECTED  AMPHETMU NONE DETECTED  THCU NONE DETECTED  LABBARB NONE DETECTED    Alcohol Level  Recent Labs  Lab 12/20/22 0535  ETH <10    IMAGING past 24 hours VAS [place] LOWER EXTREMITY VENOUS (DVT)  Result Date: 12/20/2022  Lower Venous DVT Study Patient Name:  Ashlee Carlson  Date of Exam:   12/20/2022 Medical Rec #: [id number]         Accession #:    [id number] Date of Birth: 1961/01/28        Patient Gender: F Patient Age:   62 years Exam Location:  [facility] Procedure:      VAS [place] LOWER EXTREMITY VENOUS (DVT) Referring Phys: Cornelius Moras Meela Wareing --------------------------------------------------------------------------------  Other Indications: Embolic stroke. Comparison Study: No previous study. Performing Technologist: McKayla Maag RVT, [place]  Examination Guidelines: A complete evaluation includes B-mode  imaging, spectral Doppler, color Doppler, and power Doppler as needed of all accessible portions of each vessel. Bilateral testing is considered an integral part of a complete examination. Limited examinations for reoccurring indications may be performed as noted. The reflux portion of the exam is performed with the patient in reverse Trendelenburg.  +---------+---------------+---------+-----------+----------+--------------+ RIGHT    CompressibilityPhasicitySpontaneityPropertiesThrombus Aging +---------+---------------+---------+-----------+----------+--------------+ CFV      Full           Yes      Yes                                 +---------+---------------+---------+-----------+----------+--------------+ SFJ      Full                                                        +---------+---------------+---------+-----------+----------+--------------+ FV Prox  Full                                                        +---------+---------------+---------+-----------+----------+--------------+  FV Mid   Full                                                        +---------+---------------+---------+-----------+----------+--------------+ FV DistalFull                                                        +---------+---------------+---------+-----------+----------+--------------+ PFV      Full                                                        +---------+---------------+---------+-----------+----------+--------------+ POP      Full           Yes      Yes                                 +---------+---------------+---------+-----------+----------+--------------+ PTV      Full                                                        +---------+---------------+---------+-----------+----------+--------------+ PERO     Full                                                        +---------+---------------+---------+-----------+----------+--------------+    +---------+---------------+---------+-----------+----------+--------------+ LEFT     CompressibilityPhasicitySpontaneityPropertiesThrombus Aging +---------+---------------+---------+-----------+----------+--------------+ CFV      Full           Yes      Yes                                 +---------+---------------+---------+-----------+----------+--------------+ SFJ      Full                                                        +---------+---------------+---------+-----------+----------+--------------+ FV Prox  Full                                                        +---------+---------------+---------+-----------+----------+--------------+ FV Mid   Full                                                        +---------+---------------+---------+-----------+----------+--------------+   FV DistalFull                                                        +---------+---------------+---------+-----------+----------+--------------+ PFV      Full                                                        +---------+---------------+---------+-----------+----------+--------------+ POP      Full           Yes      Yes                                 +---------+---------------+---------+-----------+----------+--------------+ PTV      Full                                                        +---------+---------------+---------+-----------+----------+--------------+ PERO     Full                                                        +---------+---------------+---------+-----------+----------+--------------+     Summary: BILATERAL: - No evidence of deep vein thrombosis seen in the lower extremities, bilaterally. - No evidence of superficial venous thrombosis in the lower extremities, bilaterally. -No evidence of popliteal cyst, bilaterally.   *See table(s) above for measurements and observations.    Preliminary    CT ANGIO HEAD NECK W WO CM  Addendum  Date: 12/20/2022   ADDENDUM REPORT: 12/20/2022 09:39 ADDENDUM: Study discussed by telephone with Dr. Fuller Plan on 12/20/2022 at 0927 hours. Electronically Signed   By: Genevie Ann M.D.   On: 12/20/2022 09:39   Result Date: 12/20/2022 CLINICAL DATA:  62 year old female with scattered right PCA territory infarction on brain MRI this morning. Left-sided hand face and leg symptoms beginning several nights ago. Headache. EXAM: CT ANGIOGRAPHY HEAD AND NECK TECHNIQUE: Multidetector CT imaging of the head and neck was performed using the standard protocol during bolus administration of intravenous contrast. Multiplanar CT image reconstructions and MIPs were obtained to evaluate the vascular anatomy. Carotid stenosis measurements (when applicable) are obtained utilizing NASCET criteria, using the distal internal carotid diameter as the denominator. RADIATION DOSE REDUCTION: This exam was performed according to the departmental dose-optimization program which includes automated exposure control, adjustment of the mA and/or kV according to patient size and/or use of iterative reconstruction technique. CONTRAST:  38mL OMNIPAQUE IOHEXOL 350 MG/ML SOLN COMPARISON:  CT head and MRI brain earlier today. Previous face CT 12/05/2003. 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 Narrow zone of transition. This expansile bone lesion is associated with the  roots of the left maxillary incisors, the lateral incisor of which is abnormal. No other acute osseous abnormality identified with mild for age cervical spine degeneration. Upper chest: Negative. Other neck: No acute soft tissue finding identified in the neck. Aortic arch: 3 vessel arch configuration. No significant arch atherosclerosis. Right carotid system: Negative aside  from mild tortuosity. Left carotid system: Negative aside from mild tortuosity. Vertebral arteries: Minimal proximal right subclavian artery plaque without stenosis. Diminutive right vertebral artery origin is patent without stenosis. Right vertebral is non dominant and remains diminutive but patent to the skull base. Proximal left subclavian artery and left vertebral artery origin are normal. The left vertebral is dominant, mildly hyperplastic and tortuous to the skull base. CTA HEAD Posterior circulation: No left vertebral artery plaque or stenosis to the basilar. Non dominant right vertebral functionally terminates in PICA. Patent basilar artery without stenosis. Patent SCA origins and basilar tip. Both PCA origins are patent, but the right PCA is occluded in the P1/P2 segment junction on series 10, image 19. Poor distal enhancement. Contralateral left PCA branches are patent although with widespread mild PCA irregularity on series 12, image 23. Anterior circulation: Both ICA siphons are patent. On the right there is mild siphon irregularity and stenosis. On the left there is supraclinoid calcified plaque and irregularity but only mild stenosis. Small left posterior communicating artery is identified. Patent carotid termini, MCA and ACA origins. Subtle irregularity at the left MCA origin, no significant stenosis. Anterior communicating artery and bilateral ACA branches are within normal limits. Right MCA M1 segment and bifurcation are patent without stenosis. Left MCA M1 segment and trifurcation are patent without stenosis. Bilateral MCA branches are patent with mild mostly M3 segment irregularity. Venous sinuses: Patent. Anatomic variants: Dominant left and highly diminutive right vertebral arteries. The left supplies the basilar and the right functionally terminates in PICA. Review of the MIP images confirms the above findings IMPRESSION: 1. Positive for Right PCA occlusion at the P1/P2 segment junction. 2. No  other large vessel occlusion. Mild for age atherosclerosis in the neck. Although more pronounced intracranial atherosclerosis affecting the 2nd and 3rd order circle-of-Willis branches. No other significant arterial stenosis. 3. 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 This is new since the 2005 face CT, and identified in retrospect on the Brain MRI this morning which suggests inspissated/complex fluid within the cyst. Recommend follow-up with OMFS. Electronically Signed: By: Genevie Ann M.D. On: 12/20/2022 09:21   MR BRAIN WO CONTRAST  Result Date: 12/20/2022 CLINICAL DATA:  62 year old female with weakness of left face, hand and leg beginning several nights ago. Headache. EXAM: MRI HEAD WITHOUT CONTRAST TECHNIQUE: Multiplanar, multiecho pulse sequences of the brain and surrounding structures were obtained without intravenous contrast. COMPARISON:  Head CT 0547 hours today. FINDINGS: Brain: Patchy multifocal diffusion restriction in the right PCA territory: - occipital pole series 5, image 61 - central right thalamus image 71 -but also throughout the right hippocampal formation (series 5, image 66) as well as punctate involvement of nearby right periatrial white matter. These areas demonstrate 2 2/FLAIR hyperintense cytotoxic edema. No hemorrhagic transformation or mass effect. No other diffusion restriction. But chronic lacunar infarcts in the bilateral other deep gray nuclei, and patchy/confluent T2 and FLAIR hyperintensity in the bilateral corona radiata. Similar T2 heterogeneity in the pons. No cortical encephalomalacia or chronic cerebral blood products identified. Cerebellum appears spared.  No midline shift, mass effect, evidence of mass lesion, ventriculomegaly, extra-axial collection or acute intracranial hemorrhage. Cervicomedullary junction and pituitary are within normal  limits. Vascular: Major intracranial vascular flow voids are preserved, distal left vertebral artery appears to be dominant. Skull and upper cervical spine: Negative. Visualized bone marrow signal is within normal limits. Sinuses/Orbits: Rightward gaze, otherwise negative. Other: Grossly normal visible internal auditory structures. Mastoids are clear. Visible scalp and face appear negative. IMPRESSION: 1. Scattered diffusion restriction and cytotoxic edema in several areas of the Right PCA territory (including thalamus, occipital pole). Conspicuous involvement of the right hippocampus, but favor acute to subacute ischemia in light of underlying advanced small vessel disease (#3) rather than seizure/status epilepticus or drug related excitotoxicity. 2. No associated hemorrhage or mass effect. 3. 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 Electronically Signed   By: Genevie Ann M.D.   On: 12/20/2022 07:38   CT HEAD WO CONTRAST  Result Date: 12/20/2022 CLINICAL DATA:  62 year old female with weakness of left face, hand and leg beginning several nights ago. Headache. EXAM: CT HEAD WITHOUT CONTRAST TECHNIQUE: Contiguous axial images were obtained from the base of the skull through the vertex without intravenous contrast. RADIATION DOSE REDUCTION: This exam was performed according to the departmental dose-optimization program which includes automated exposure control, adjustment of the mA and/or kV according to patient size and/or use of iterative reconstruction technique. COMPARISON:  Head CT 01/05/2015. FINDINGS: Brain: Cerebral volume remains normal for age. But Patchy and confluent bilateral cerebral white matter hypodensity and moderate heterogeneity throughout the bilateral deep gray nuclei has progressed since 2016. Asymmetric involvement is greater on the right side including at the caudate (series 3, image 17) and  thalamus (image 15). Furthermore, there is a small area of cortical hypodensity in the right occipital pole on series 13 which is new seems new or increased. No superimposed midline shift, ventriculomegaly, mass effect, evidence of mass lesion, intracranial hemorrhage. No other acute cortically based infarct identified. Vascular: Calcified atherosclerosis at the skull base. No suspicious intracranial vascular hyperdensity. Skull: No acute osseous abnormality identified. Sinuses/Orbits: Left maxillary sinus periosteal thickening, but opacification of that sinus has resolved since 2016. Other Visualized paranasal sinuses and mastoids are stable and well aerated. Other: Slight leftward gaze. No other acute orbit or scalp soft tissue finding. IMPRESSION: 1. Positive for Age Indeterminate Ischemic changes in the brain, mostly small vessel type including extensive involvement of the deep gray nuclei. But also difficult to exclude a small acute or subacute occipital pole Right PCA territory infarct. 2. No acute intracranial hemorrhage or mass effect. Electronically Signed   By: Genevie Ann M.D.   On: 12/20/2022 06:05    PHYSICAL EXAM  Temp:  [98 F (36.7 C)-98.6 F (37 C)] 98.6 F (37 C) (03/21 1330) Pulse Rate:  [74-95] 85 (03/21 1330) Resp:  [17-27] 19 (03/21 1330) BP: (155-220)/(74-150) 155/94 (03/21 1330) SpO2:  [98 %-100 %] 100 % (03/21 1330)  General - Well nourished, well developed, in no apparent distress.  Cardiovascular - Regular rhythm and rate.  Neuro: Mental Status: Awake, alert, oriented to person, place, month, year, and situation. Good attention, able to give a clear and coherent history. No aphasia present. Cranial Nerves: II:?  Partial left upper quadrant field cut.  PERRL.  III,IV, VI: EOMI without ptosis or diploplia.  V: Facial sensation is diminished on the left VII: Facial movement with mild left-sided weakness  VIII: hearing is intact to voice X: Uvula elevates  symmetrically XI: Shoulder shrug is symmetric. XII: tongue is midline without atrophy or fasciculations.  Motor: Tone is normal. Bulk is normal.  RUE: 5/5, no drift, 5/5 grip LUE: 4/5, no drift, 4/5 grip Decreased grip strength on the left, good strength in bilateral legs Sensory: Sensation is diminished throughout the left side Cerebellar: No clear ataxia   ASSESSMENT/PLAN MIRELLA MILMAN is a 62 y.o. female with a history of hypertension who presents with left-sided numbness that started abruptly Monday evening.  She states that is been essentially the same since it started, not getting worse and not getting better.  CT/MRI reveals small vessel disease, questionable right PCA territory infarct, which would explain her left-sided numbness.   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 No acute intracranial hemorrhage or mass effect CTA head & neck: pending Positive for Right PCA occlusion at the P1/P2 segment junction. More pronounced intracranial atherosclerosis affecting the 2nd and 3rd order circle-of-Willis branches.  MRI:  Scattered diffusion restriction and cytotoxic edema in several areas of the Right PCA territory. Underlying advance small vessel disease. TEE pending VAS [place] LE DVT: Negative Recommend 30 day cardiac event monitoring as outpt to rule out afib if TEE neg Hypercoagulable Panel pending LDL: 69 HgbA1c: pending Hypercoagulable work up pending VTE prophylaxis - lovenox No antithrombotic prior to admission, now on aspirin 81 mg daily and clopidogrel 75 mg daily for 3 months, then aspirin alone given right PCA occlusion.  Therapy recommendations:  outpt PT/OT Disposition:  pending  Hypertension Hypertensive Urgency Home meds:  norvasc 5mg , patient reported poor  compliance Losartan 50mg  added, Norvasc increase to 10mg  Unstable, high on presentation On norvasc 10 and add losartan 50 Symptoms are now over 31 days old, no need for permissive hypertension BP goal normotensive  Hyperlipidemia Home meds:  none LDL 69., goal < 70 Add Crestor 10mg  for secondary stroke prevention No high intensity statin due to LDL within goal. 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 Note/plan to be edited by MD as needed.    Otelia Santee, DNP, AGACNP-BC [facility] Please use AMION for pager and EPIC for messaging  ATTENDING NOTE: I reviewed above note and agree with the assessment and plan. Pt was seen and examined.   Parents and daughter are at the bedside. Pt lying in bed, still complaining of left sided numbness, but seems improving. Complaining left hand clumsy since stroke. But no vision changes. MRI showed right MCA stroke and CTA showed right PCA occlusion. Will check TEE in am. LDL 69 but UDS showed positive on cocaine. BP high on presentation. She has not taken BP meds consistently at home and not check BP at home  On exam, awake, alert, eyes open, orientated to age, place, time  and people. No aphasia, fluent language, following all simple commands. Able to name and repeat and read. No gaze palsy, tracking bilaterally, visual field full. No facial droop. Tongue midline. RUE and RLE 5/5, LUE proximal 5/5, distal finger grip 4/5 with  decreased dexterity. LLE lack of effort on exam 4/5 proximal and distal, b/l FTN intact grossly although slow on the left, gait not tested.   Pt stroke likely large vessel disease given uncontrolled risk factors with HTN, smoker and cocaine use. Cardioembolic source less likely but will do TEE and 30 day monitoring given her young age. Will also check hypercoagulable labs. On DAPT for 3 month and low dose statin. Will follow.   For detailed assessment and plan, please refer to above/below as I have made changes wherever appropriate.   Rosalin Hawking, MD PhD Stroke Neurology 12/20/2022 4:58 PM     To contact Stroke Continuity provider, please refer to [email or web address] After hours, contact General Neurology

## 2022-12-21 ENCOUNTER — Encounter (HOSPITAL_COMMUNITY): Payer: Self-pay | Admitting: Internal Medicine

## 2022-12-21 ENCOUNTER — Inpatient Hospital Stay (HOSPITAL_COMMUNITY): Payer: 59

## 2022-12-21 ENCOUNTER — Encounter (HOSPITAL_COMMUNITY)
Admission: EM | Disposition: A | Discharge status: Home / Self Care | Payer: Self-pay | Source: Home / Self Care | Attending: Internal Medicine

## 2022-12-21 ENCOUNTER — Inpatient Hospital Stay (HOSPITAL_COMMUNITY): Payer: 59 | Admitting: Anesthesiology

## 2022-12-21 DIAGNOSIS — I63531 Cerebral infarction due to unspecified occlusion or stenosis of right posterior cerebral artery: Secondary | ICD-10-CM | POA: Diagnosis not present

## 2022-12-21 DIAGNOSIS — F17210 Nicotine dependence, cigarettes, uncomplicated: Secondary | ICD-10-CM

## 2022-12-21 DIAGNOSIS — F17200 Nicotine dependence, unspecified, uncomplicated: Secondary | ICD-10-CM | POA: Diagnosis not present

## 2022-12-21 DIAGNOSIS — I639 Cerebral infarction, unspecified: Secondary | ICD-10-CM

## 2022-12-21 DIAGNOSIS — E781 Pure hyperglyceridemia: Secondary | ICD-10-CM | POA: Diagnosis not present

## 2022-12-21 DIAGNOSIS — M8560 Other cyst of bone, unspecified site: Secondary | ICD-10-CM | POA: Diagnosis not present

## 2022-12-21 DIAGNOSIS — I160 Hypertensive urgency: Secondary | ICD-10-CM | POA: Diagnosis not present

## 2022-12-21 DIAGNOSIS — I10 Essential (primary) hypertension: Secondary | ICD-10-CM

## 2022-12-21 DIAGNOSIS — Z8673 Personal history of transient ischemic attack (TIA), and cerebral infarction without residual deficits: Secondary | ICD-10-CM

## 2022-12-21 DIAGNOSIS — F1410 Cocaine abuse, uncomplicated: Secondary | ICD-10-CM | POA: Diagnosis not present

## 2022-12-21 HISTORY — PX: TEE WITHOUT CARDIOVERSION: SHX5443

## 2022-12-21 HISTORY — PX: BUBBLE STUDY: SHX6837

## 2022-12-21 LAB — ECHO TEE

## 2022-12-21 LAB — BETA-2-GLYCOPROTEIN I ABS, IGG/M/A
Beta-2 Glyco I IgG: <9 GPI IgG units (ref 0–20)
Beta-2-Glycoprotein I IgA: 10 GPI IgA units (ref 0–25)
Beta-2-Glycoprotein I IgM: 10 GPI IgM units (ref 0–32)

## 2022-12-21 SURGERY — ECHOCARDIOGRAM, TRANSESOPHAGEAL
Anesthesia: Monitor Anesthesia Care

## 2022-12-21 MED ORDER — PROPOFOL 10 MG/ML IV BOLUS
INTRAVENOUS | Status: DC | PRN
  Administered 2022-12-21: 50 mg via INTRAVENOUS
  Administered 2022-12-21: 30 mg via INTRAVENOUS

## 2022-12-21 MED ORDER — SODIUM CHLORIDE 0.9 % IV SOLN: INTRAVENOUS | Status: DC

## 2022-12-21 MED ORDER — PROPOFOL 500 MG/50ML IV EMUL
INTRAVENOUS | Status: DC | PRN
  Administered 2022-12-21: 125 ug/kg/min via INTRAVENOUS

## 2022-12-21 MED ORDER — LIP MEDEX EX OINT
1.0000 | TOPICAL_OINTMENT | CUTANEOUS | Status: DC | PRN
  Administered 2022-12-21: 1 via TOPICAL
  Filled 2022-12-21: qty 7

## 2022-12-21 NOTE — Progress Notes (Addendum)
STROKE TEAM PROGRESS NOTE   INTERVAL HISTORY No family at bedside. TEE scheduled for today  Patient with no new neurological symptoms Neurological exam is stable.   Vitals:   12/21/22 0446 12/21/22 0800 12/21/22 1157 12/21/22 1158  BP: (!) 177/71 (!) 176/114  (!) 184/85  Pulse: 70 77  70  Resp: 18 13  20   Temp: 98.6 F (37 C) 98.2 F (36.8 C)  (!) 97.5 F (36.4 C)  TempSrc: Oral   Temporal  SpO2: 96% 98%  97%  Weight:   99.3 kg   Height:   5\' 5"  (1.651 m)    CBC:  Recent Labs  Lab 12/20/22 0535 12/20/22 0545  WBC 7.3  --   NEUTROABS 3.5  --   HGB 12.8 13.6  HCT 38.9 40.0  MCV 92.8  --   PLT 300  --     Basic Metabolic Panel:  Recent Labs  Lab 12/20/22 0535 12/20/22 0545  NA 138 141  K 3.5 3.2*  CL 103 103  CO2 26  --   GLUCOSE 88 82  BUN 17 21*  CREATININE 1.12* 1.10*  CALCIUM 9.3  --     Lipid Panel:  Recent Labs  Lab 12/20/22 0825  CHOL 170  TRIG 179*  HDL 65  CHOLHDL 2.6  VLDL 36  LDLCALC 69    HgbA1c:  Recent Labs  Lab 12/20/22 0825  HGBA1C 6.1*   Urine Drug Screen:  Recent Labs  Lab 12/20/22 0954  LABOPIA NONE DETECTED  COCAINSCRNUR POSITIVE*  LABBENZ NONE DETECTED  AMPHETMU NONE DETECTED  THCU NONE DETECTED  LABBARB NONE DETECTED     Alcohol Level  Recent Labs  Lab 12/20/22 0535  ETH <10     IMAGING past 24 hours No results found.  PHYSICAL EXAM  Temp:  [97.5 F (36.4 C)-98.7 F (37.1 C)] 97.5 F (36.4 C) (03/22 1158) Pulse Rate:  [70-85] 70 (03/22 1158) Resp:  [13-37] 20 (03/22 1158) BP: (135-212)/(71-114) 184/85 (03/22 1158) SpO2:  [95 %-100 %] 97 % (03/22 1158) Weight:  [99.3 kg] 99.3 kg (03/22 1157)  General - Well nourished, well developed, in no apparent distress.  Cardiovascular - Regular rhythm and rate.  Neuro: Mental Status: Awake, alert, oriented to person, place, month, year, and situation. Good attention, able to give a clear and coherent history. No aphasia present. Cranial  Nerves: II:?  Partial left upper quadrant field cut.  PERRL.  III,IV, VI: EOMI without ptosis or diploplia.  V: Facial sensation is diminished on the left VII: Facial movement with mild left-sided weakness VIII: hearing is intact to voice X: Uvula elevates symmetrically XI: Shoulder shrug is symmetric. XII: tongue is midline without atrophy or fasciculations.  Motor: Tone is normal. Bulk is normal.  RUE: 5/5, no drift, 5/5 grip LUE: 4/5, no drift, 4/5 grip Decreased grip strength on the left, good strength in bilateral legs Sensory: Sensation is diminished throughout the left side Cerebellar: No clear ataxia   ASSESSMENT/PLAN Ashlee Carlson is a 62 y.o. female with a history of hypertension who presents with left-sided numbness that started abruptly Monday evening.  She states that is been essentially the same since it started, not getting worse and not getting better.  CT/MRI reveals small vessel disease, questionable right PCA territory infarct, which would explain her left-sided numbness.   Stroke: R PCA infarct with right P1/P2 occlusion, likely large vessel disease from uncontrolled hypertension, smoker and cocaine use Code Stroke CT head difficult to exclude a  small acute or subacute occipital pole Right PCA territory infarct. No acute intracranial hemorrhage or mass effect CTA head & neck: pending Positive for Right PCA occlusion at the P1/P2 segment junction. More pronounced intracranial atherosclerosis affecting the 2nd and 3rd order circle-of-Willis branches.  MRI:  Scattered diffusion restriction and cytotoxic edema in several areas of the Right PCA territory. Underlying advance small vessel disease. TEE unremarkable VAS [place] LE DVT: Negative Recommend 30 day cardiac event monitoring as outpt to rule out afib Hypercoagulable Panel pending LDL: 69 HgbA1c: 6.1 UDS positive for cocaine Hypercoagulable work up pending Will need neurology follow up as outpatient in  4-weeks VTE prophylaxis - lovenox No antithrombotic prior to admission, now on aspirin 81 mg daily and clopidogrel 75 mg daily for 3 months, then aspirin alone given right PCA occlusion.  Therapy recommendations:  outpt PT/OT Disposition:  pending  Hypertension Hypertensive Urgency Home meds:  norvasc 5mg , patient reported poor compliance Losartan 50mg  added, Norvasc increase to 10mg  Unstable, high on presentation On norvasc 10 and losartan 50 Symptoms are now over 60 days old, no need for permissive hypertension BP goal normotensive  Hyperlipidemia Home meds:  none LDL 69., goal < 70 Add Crestor 10mg  for secondary stroke prevention No high intensity statin due to LDL within goal. Continue statin at discharge  Tobacco abuse Current smoker Smoking cessation counseling provided Pt is willing to quit  Cocaine abuse UDS positive for cocaine Cessation education provided Pt is willing to quit  Other Stroke Risk Factors Obesity, Body mass index is 36.44 kg/m., BMI >/= 30 associated with increased stroke risk, recommend weight loss, diet and exercise as appropriate   Other Active Problems Cystic Bone Lesion New since 2005 facial CT, possible fluid in cyst seen on MRI  Hospital day # 1  Pt seen by Neuro NP/APP and later by MD. Note/plan to be edited by MD as needed.    Beulah Gandy DNP, ACNPC-AG  [facility]  ATTENDING NOTE: I reviewed above note and agree with the assessment and plan. Pt was seen and examined.   No acute event overnight, neuro stable.  TEE unremarkable.  Recommend 30-day CardioNet monitoring to rule out A-fib.  Smoking and cocaine cessation education provided.  Continue DAPT for 3 months and then aspirin alone, continue Crestor.  PT/OT recommend outpatient  For detailed assessment and plan, please refer to above/below as I have made changes wherever appropriate.   Neurology will sign off. Please call with questions. Pt will follow up with  stroke clinic NP at [facility] in about 4 weeks. Thanks for the consult.   Rosalin Hawking, MD PhD Stroke Neurology 12/21/2022 7:34 PM      To contact Stroke Continuity provider, please refer to [email or web address] After hours, contact General Neurology

## 2022-12-21 NOTE — Progress Notes (Signed)
Arrived back from Endo/TEE at this time. Vitals obtained.

## 2022-12-21 NOTE — TOC CAGE-AID Note (Signed)
Transition of Care [facility]) - CAGE-AID Screening   Patient Details  Name: Ashlee Carlson MRN: [id number] Date of Birth: [birth year]  [facility]) CM/SW Contact:    Benard Halsted, LCSW Phone Number: 12/21/2022, 4:09 PM   Clinical Narrative: Patient denied need for resources.    CAGE-AID Screening:    Have You Ever Felt You Ought to Cut Down on Your Drinking or Drug Use?: No Have People Annoyed You By Critizing Your Drinking Or Drug Use?: No Have You Felt Bad Or Guilty About Your Drinking Or Drug Use?: No Have You Ever Had a Drink or Used Drugs First Thing In The Morning to Steady Your Nerves or to Get Rid of a Hangover?: No CAGE-AID Score: 0

## 2022-12-21 NOTE — Anesthesia Postprocedure Evaluation (Signed)
Anesthesia Post Note  Patient: Ashlee Carlson  Procedure(s) Performed: TRANSESOPHAGEAL ECHOCARDIOGRAM (TEE) BUBBLE STUDY     Patient location during evaluation: PACU Anesthesia Type: MAC Level of consciousness: awake and alert Pain management: pain level controlled Vital Signs Assessment: post-procedure vital signs reviewed and stable Respiratory status: spontaneous breathing, nonlabored ventilation and respiratory function stable Cardiovascular status: stable and blood pressure returned to baseline Anesthetic complications: no   No notable events documented.  Last Vitals:  Vitals:   12/21/22 1322 12/21/22 1345  BP: (!) 142/66 (!) 176/89  Pulse: 76 76  Resp: (!) 21 (!) 23  Temp:    SpO2: 98% 100%    Last Pain:  Vitals:   12/21/22 1322  TempSrc:   PainSc: 0-No pain                 Audry Pili

## 2022-12-21 NOTE — Progress Notes (Signed)
OT Cancellation Note  Patient Details Name: Ashlee Carlson MRN: [id number] DOB: 1961/06/14   Cancelled Treatment:    Reason Eval/Treat Not Completed: Patient declined, no reason specified (Patient decliend OT today and reported she would like to get some sleep, stated has been up and moving earlier.)  12/21/2022  AB, OTR/L  Acute Rehabilitation Services  Office: [place] 12/21/2022, 4:25 PM

## 2022-12-21 NOTE — Plan of Care (Signed)
Went for TEE today. Stroke education reviewed. NIHSS 4. Updated daughter and Dr. Louanne Belton provided daughter with documentation needed from [facility] service to have her visit. Ambulating with standby assistance.   Problem: Education: Goal: Knowledge of disease or condition will improve Outcome: Progressing Goal: Knowledge of secondary prevention will improve (MUST DOCUMENT ALL) Outcome: Progressing Goal: Knowledge of patient specific risk factors will improve [place]ta [place] N/A or DELETE if not current risk factor) Outcome: Progressing   Problem: Ischemic Stroke/TIA Tissue Perfusion: Goal: Complications of ischemic stroke/TIA will be minimized Outcome: Progressing   Problem: Coping: Goal: Will verbalize positive feelings about self Outcome: Progressing Goal: Will identify appropriate support needs Outcome: Progressing   Problem: Education: Goal: Knowledge of General Education information will improve Description: Including pain rating scale, medication(s)/side effects and non-pharmacologic comfort measures Outcome: Progressing   Problem: Health Behavior/Discharge Planning: Goal: Ability to manage health-related needs will improve Outcome: Progressing

## 2022-12-21 NOTE — Interval H&P Note (Signed)
History and Physical Interval Note:  12/21/2022 10:01 AM  Ashlee Carlson  has presented today for surgery, with the diagnosis of STROKE.  The various methods of treatment have been discussed with the patient and family. After consideration of risks, benefits and other options for treatment, the patient has consented to  Procedure(s): TRANSESOPHAGEAL ECHOCARDIOGRAM (TEE) (N/A) as a surgical intervention.  The patient's history has been reviewed, patient examined, no change in status, stable for surgery.  I have reviewed the patient's chart and labs.  Questions were answered to the patient's satisfaction.     Hildegard Hlavac

## 2022-12-21 NOTE — Evaluation (Signed)
Physical Therapy Evaluation Patient Details Name: Ashlee Carlson MRN: [id number] DOB: Feb 21, 1961 Today's Date: 12/21/2022  History of Present Illness  Pt is a 62 y/o female presenting on 3/21 with L sided numbness and weakness x 2 days. CT reveals several subacute areas, including occipital lobe. MRI with scattered diffusion restriction and cytotoxic edema in R PCA territory (including thalamus, occipital pole). PMH includes: HTN, shingles.  Clinical Impression  Pt presents today with impaired functional mobility after CVA, with noted left sided weakness, coordination, and sensation deficits as well as balance impairments. 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 Pt performing mobility today with minG to supervision with use of RW. Attempted ambulation without AD but pt reaching for UE support and noted imbalance, progressing with use of RW, recommend at discharge. Pt declined attempt at stairs, educated on sequencing, acute PT will continue to follow up to progress mobility, recommend OPPT at discharge. Will follow as appropriate.      Recommendations for follow up therapy are one component of a multi-disciplinary discharge planning process, led by the attending physician.  Recommendations may be updated based on patient status, additional functional criteria and insurance authorization.  Follow Up Recommendations Outpatient PT      Assistance Recommended at Discharge Intermittent Supervision/Assistance  Patient can return home with the following  A little help with walking and/or transfers;Help with stairs or ramp for entrance;Assist for transportation    Equipment Recommendations Rolling walker (2 wheels)  Recommendations for Other Services       Functional Status Assessment Patient has had a recent decline in their functional status and demonstrates the ability to make  significant improvements in function in a reasonable and predictable amount of time.     Precautions / Restrictions Precautions Precautions: Fall Restrictions Weight Bearing Restrictions: No      Mobility  Bed Mobility Overal bed mobility: Needs Assistance Bed Mobility: Supine to Sit, Sit to Supine     Supine to sit: Supervision, HOB elevated Sit to supine: Supervision, HOB elevated   General bed mobility comments: supervision for safety and line management    Transfers Overall transfer level: Needs assistance Equipment used: None Transfers: Sit to/from Stand Sit to Stand: Min guard           General transfer comment: minG for safety and cueing for proper hand placement    Ambulation/Gait Ambulation/Gait assistance: Supervision, Min guard Gait Distance (Feet): 225 Feet (x10 feet without AD) Assistive device: Rolling walker (2 wheels), None Gait Pattern/deviations: Decreased stride length, Step-through pattern, Drifts right/left, Decreased dorsiflexion - left Gait velocity: decreased     General Gait Details: ambulated a few feet without AD, pt reaching for UE support with imbalance noted. RW utilized and imbalance improved and pt reports feeling more stable. Cued for posture and proximity to RW, slow controlled turns without LOB  Stairs            Wheelchair Mobility    Modified Rankin (Stroke Patients Only) Modified Rankin (Stroke Patients Only) Pre-Morbid Rankin Score: No symptoms Modified Rankin: Moderate disability     Balance Overall balance assessment: Needs assistance Sitting-balance support: No upper extremity supported, Feet supported Sitting balance-Leahy Scale: Fair     Standing balance support: Bilateral upper extremity supported, During functional activity Standing balance-Leahy Scale: Poor Standing balance comment: reliant on UE support for static standing and ambulation  Pertinent  Vitals/Pain Pain Assessment Pain Assessment: No/denies pain    Home Living Family/patient expects to be discharged to:: Private residence Living Arrangements: Alone Available Help at Discharge: Family;Available PRN/intermittently Type of Home: House Home Access: Stairs to enter Entrance Stairs-Rails: Right Entrance Stairs-Number of Steps: 6   Home Layout: One level Home Equipment: Kasandra Knudsen - single point Additional Comments: daughter is coming from [place] to stay with pt for a little while, pt also has parents and sister who can assist as needed.    Prior Function Prior Level of Function : Independent/Modified Independent;Working/employed;Driving             Mobility Comments: Intermittent use of SPC at baseline, drives and works, denies any falls.       Hand Dominance   Dominant Hand: Right    Extremity/Trunk Assessment   Upper Extremity Assessment Upper Extremity Assessment: Defer to OT evaluation    Lower Extremity Assessment Lower Extremity Assessment: LLE deficits/detail LLE Deficits / Details: hip flexion ~3+/5, knee extension ~4/5, knee flexion ~3+/5, ankle DF/PF ~4/5. Decreased coordination in LLE and impaired sensation to light touch and noxious stimuli LLE Sensation: decreased light touch LLE Coordination: decreased fine motor;decreased gross motor    Cervical / Trunk Assessment Cervical / Trunk Assessment: Normal  Communication   Communication: No difficulties  Cognition Arousal/Alertness: Awake/alert Behavior During Therapy: WFL for tasks assessed/performed Overall Cognitive Status: Within Functional Limits for tasks assessed                                 General Comments: A&Ox4, pleasant throughout session, responds appropriately        General Comments General comments (skin integrity, edema, etc.): VSS on room air, sister at bedside upon arrival but leaving during session    Exercises     Assessment/Plan    PT Assessment  Patient needs continued PT services  PT Problem List Decreased strength;Decreased activity tolerance;Decreased balance;Decreased mobility;Decreased coordination;Impaired sensation       PT Treatment Interventions DME instruction;Gait training;Stair training;Functional mobility training;Therapeutic activities;Therapeutic exercise;Balance training;Neuromuscular re-education;Patient/family education    PT Goals (Current goals can be found in the Care Plan section)  Acute Rehab PT Goals Patient Stated Goal: go home PT Goal Formulation: With patient Time For Goal Achievement: 01/04/23 Potential to Achieve Goals: Good    Frequency Min 4X/week     Co-evaluation               AM-PAC PT "6 Clicks" Mobility  Outcome Measure Help needed turning from your back to your side while in a flat bed without using bedrails?: A Little Help needed moving from lying on your back to sitting on the side of a flat bed without using bedrails?: A Little Help needed moving to and from a bed to a chair (including a wheelchair)?: A Little Help needed standing up from a chair using your arms (e.g., wheelchair or bedside chair)?: A Little Help needed to walk in hospital room?: A Little Help needed climbing 3-5 steps with a railing? : A Lot 6 Click Score: 17    End of Session Equipment Utilized During Treatment: Gait belt Activity Tolerance: Patient tolerated treatment well Patient left: in bed;with call bell/phone within reach Nurse Communication: Mobility status PT Visit Diagnosis: Unsteadiness on feet (R26.81);Other abnormalities of gait and mobility (R26.89);Hemiplegia and hemiparesis Hemiplegia - Right/Left: Left Hemiplegia - dominant/non-dominant: Non-dominant Hemiplegia - caused by: Cerebral infarction    Time: [phone]  PT Time Calculation (min) (ACUTE ONLY): 21 min   Charges:   PT Evaluation $PT Eval Moderate Complexity: 1 Mod          Charlynne Cousins, PT DPT Acute Rehabilitation  Services Office [phone]   Luvenia Heller 12/21/2022, 10:51 AM

## 2022-12-21 NOTE — Progress Notes (Signed)
PROGRESS NOTE    Ashlee Carlson  [id number] DOB: Jan 06, 1961 DOA: 12/20/2022 PCP: Patient, No Pcp Per    Brief Narrative:  Ashlee Carlson is a 62 y.o. female with past medical history of hypertension, shingles presented to hospital with left-sided numbness and weakness that started 3 days back which was progressive in nature with difficulty walking and holding stuff in her hand.  In the ED patient had elevated blood pressure.  CT of the head was done which showed  age-indeterminate ischemic changes in the brain mostly small vessel type including extensive involvement of the deep gray matter.  Neurology was consulted.  Patient was not a candidate for tPA because of the timeline.  Labs were within normal range.  Subsequently MRI of the brain was done which showed  scattered diffusion restriction and cytotoxic edema in several areas of the right PCA territory with possible involvement of the right hippocampus and advanced chronic small vessel disease bilaterally.  Patient was then started on aspirin and Plavix per neurology recommendations and was admitted hospital for stroke workup.  Assessment and plan.  Acute CVA CT with concern for stroke and MRI with scattered diffusion restriction in the right PCA territory.  Not a candidate for tPA due to being out of the therapeutic window.  On aspirin and Plavix and Crestor..  Risk factors include recent cocaine use and uncontrolled hypertension.  CT angiogram of the head and neck positive for right PCA occlusion at P1 P2 segment no other large vessel occlusion.  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  Recommended outpatient follow-up with oral maxillary facial surgery.  Hemoglobin A1c was 6.1.  Lipid panel noted with triglyceride of 175, HDL 65, LDL 69.  Urine drug screen was positive for cocaine.  Hypercoagulable workup has been sent.  EEG was negative.  Patient will need 30-day cardiac  event monitoring as outpatient .   PT recommends outpatient PT.  Neurology recommends aspirin Plavix for 3 months then aspirin alone.   Hypertensive urgency Could be secondary to cocaine usage.  Elevated on presentation.  Amlodipine dose was increased to 10 mg.  On as needed hydralazine.  Neurology has added losartan 50 today.  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 We need to follow-up with oral maxillofacial surgery as outpatient.  Patient denies any symptoms.   Hypertriglyceridemia Has been started on fenofibrate.   Cocaine abuse Urine drug screen was positive for cocaine.  Counseling done.  Patient states that somebody made a mistake and she took as salt.     DVT prophylaxis: enoxaparin (LOVENOX) injection 40 mg Start: 12/20/22 2200   Code Status:     Code Status: Full Code  Disposition: Home with home health.  Status is: Inpatient Remains inpatient appropriate because: Stroke workup.   Family Communication: None at bedside.  Consultants:  Neurology Cardiology  Procedures:  TEE on 12/21/2022  Antimicrobials:  None  Anti-infectives (From admission, onward)    None      Subjective: Today, patient was seen and examined at bedside.  Patient complained of left-sided weakness.  Denies any headache, dizziness, lightheadedness.  Objective: Vitals:   12/21/22 1307 12/21/22 1312 12/21/22 1317 12/21/22 1322  BP: (!) 140/71 138/62 (!) 142/66 (!) 142/66  Pulse: 91 79 80 76  Resp: 20 (!) 23 19 (!) 21  Temp:      TempSrc:      SpO2:  98% 97% 95% 98%  Weight:      Height:        Intake/Output Summary (Last 24 hours) at 12/21/2022 1330 Last data filed at 12/21/2022 1258 Gross per 24 hour  Intake 2013.77 ml  Output --  Net 2013.77 ml   Filed Weights   12/21/22 1157  Weight: 99.3 kg    Physical Examination: Body mass index is 36.44 kg/m.  General: Obese built, not in obvious distress HENT:   No scleral pallor or  icterus noted. Oral mucosa is moist.  Chest:  Clear breath sounds.  Diminished breath sounds bilaterally. No crackles or wheezes.  CVS: S1 &S2 heard. No murmur.  Regular rate and rhythm. Abdomen: Soft, nontender, nondistended.  Bowel sounds are heard.   Extremities: No cyanosis, clubbing or edema.  Peripheral pulses are palpable. Psych: Alert, awake and oriented, normal mood CNS:  No cranial nerve deficits.  Left-sided weakness mild.. Skin: Warm and dry.  No rashes noted.  Data Reviewed:   CBC: Recent Labs  Lab 12/20/22 0535 12/20/22 0545  WBC 7.3  --   NEUTROABS 3.5  --   HGB 12.8 13.6  HCT 38.9 40.0  MCV 92.8  --   PLT 300  --     Basic Metabolic Panel: Recent Labs  Lab 12/20/22 0535 12/20/22 0545  NA 138 141  K 3.5 3.2*  CL 103 103  CO2 26  --   GLUCOSE 88 82  BUN 17 21*  CREATININE 1.12* 1.10*  CALCIUM 9.3  --     Liver Function Tests: Recent Labs  Lab 12/20/22 0535  AST 29  ALT 15  ALKPHOS 60  BILITOT 0.9  PROT 7.0  ALBUMIN 3.8     Radiology Studies: VAS [place] LOWER EXTREMITY VENOUS (DVT)  Result Date: 12/20/2022  Lower Venous DVT Study Patient Name:  Ashlee Carlson  Date of Exam:   12/20/2022 Medical Rec #: [id number]         Accession #:    [id number] Date of Birth: 04-08-1961        Patient Gender: F Patient Age:   30 years Exam Location:  [facility] Procedure:      VAS [place] LOWER EXTREMITY VENOUS (DVT) Referring Phys: Cornelius Moras XU --------------------------------------------------------------------------------  Other Indications: Embolic stroke. Comparison Study: No previous study. Performing Technologist: McKayla Maag RVT, [place]  Examination Guidelines: A complete evaluation includes B-mode imaging, spectral Doppler, color Doppler, and power Doppler as needed of all accessible portions of each vessel. Bilateral testing is considered an integral part of a complete examination. Limited examinations for reoccurring indications may be performed as noted.  The reflux portion of the exam is performed with the patient in reverse Trendelenburg.  +---------+---------------+---------+-----------+----------+--------------+ RIGHT    CompressibilityPhasicitySpontaneityPropertiesThrombus Aging +---------+---------------+---------+-----------+----------+--------------+ CFV      Full           Yes      Yes                                 +---------+---------------+---------+-----------+----------+--------------+ SFJ      Full                                                        +---------+---------------+---------+-----------+----------+--------------+ FV Prox  Full                                                        +---------+---------------+---------+-----------+----------+--------------+  FV Mid   Full                                                        +---------+---------------+---------+-----------+----------+--------------+ FV DistalFull                                                        +---------+---------------+---------+-----------+----------+--------------+ PFV      Full                                                        +---------+---------------+---------+-----------+----------+--------------+ POP      Full           Yes      Yes                                 +---------+---------------+---------+-----------+----------+--------------+ PTV      Full                                                        +---------+---------------+---------+-----------+----------+--------------+ PERO     Full                                                        +---------+---------------+---------+-----------+----------+--------------+   +---------+---------------+---------+-----------+----------+--------------+ LEFT     CompressibilityPhasicitySpontaneityPropertiesThrombus Aging +---------+---------------+---------+-----------+----------+--------------+ CFV      Full           Yes       Yes                                 +---------+---------------+---------+-----------+----------+--------------+ SFJ      Full                                                        +---------+---------------+---------+-----------+----------+--------------+ FV Prox  Full                                                        +---------+---------------+---------+-----------+----------+--------------+ FV Mid   Full                                                        +---------+---------------+---------+-----------+----------+--------------+  FV DistalFull                                                        +---------+---------------+---------+-----------+----------+--------------+ PFV      Full                                                        +---------+---------------+---------+-----------+----------+--------------+ POP      Full           Yes      Yes                                 +---------+---------------+---------+-----------+----------+--------------+ PTV      Full                                                        +---------+---------------+---------+-----------+----------+--------------+ PERO     Full                                                        +---------+---------------+---------+-----------+----------+--------------+     Summary: BILATERAL: - No evidence of deep vein thrombosis seen in the lower extremities, bilaterally. - No evidence of superficial venous thrombosis in the lower extremities, bilaterally. -No evidence of popliteal cyst, bilaterally.   *See table(s) above for measurements and observations. Electronically signed by Jamelle Haring on 12/20/2022 at 3:46:45 PM.    Final    CT ANGIO HEAD NECK W WO CM  Addendum Date: 12/20/2022   ADDENDUM REPORT: 12/20/2022 09:39 ADDENDUM: Study discussed by telephone with Dr. Fuller Plan on 12/20/2022 at 0927 hours. Electronically Signed   By: Genevie Ann M.D.   On: 12/20/2022  09:39   Result Date: 12/20/2022 CLINICAL DATA:  62 year old female with scattered right PCA territory infarction on brain MRI this morning. Left-sided hand face and leg symptoms beginning several nights ago. Headache. EXAM: CT ANGIOGRAPHY HEAD AND NECK TECHNIQUE: Multidetector CT imaging of the head and neck was performed using the standard protocol during bolus administration of intravenous contrast. Multiplanar CT image reconstructions and MIPs were obtained to evaluate the vascular anatomy. Carotid stenosis measurements (when applicable) are obtained utilizing NASCET criteria, using the distal internal carotid diameter as the denominator. RADIATION DOSE REDUCTION: This exam was performed according to the departmental dose-optimization program which includes automated exposure control, adjustment of the mA and/or kV according to patient size and/or use of iterative reconstruction technique. CONTRAST:  11mL OMNIPAQUE IOHEXOL 350 MG/ML SOLN COMPARISON:  CT head and MRI brain earlier today. Previous face CT 12/05/2003. 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 Narrow zone  of transition. This expansile bone lesion is associated with the roots of the left maxillary incisors, the lateral incisor of which is abnormal. No other acute osseous abnormality identified with mild for age cervical spine degeneration. Upper chest: Negative. Other neck: No acute soft tissue finding identified in the neck. Aortic arch: 3 vessel arch configuration. No significant arch atherosclerosis. Right carotid system: Negative aside from mild tortuosity. Left carotid system: Negative aside from mild tortuosity. Vertebral arteries: Minimal proximal right subclavian artery plaque without stenosis. Diminutive right vertebral artery  origin is patent without stenosis. Right vertebral is non dominant and remains diminutive but patent to the skull base. Proximal left subclavian artery and left vertebral artery origin are normal. The left vertebral is dominant, mildly hyperplastic and tortuous to the skull base. CTA HEAD Posterior circulation: No left vertebral artery plaque or stenosis to the basilar. Non dominant right vertebral functionally terminates in PICA. Patent basilar artery without stenosis. Patent SCA origins and basilar tip. Both PCA origins are patent, but the right PCA is occluded in the P1/P2 segment junction on series 10, image 19. Poor distal enhancement. Contralateral left PCA branches are patent although with widespread mild PCA irregularity on series 12, image 23. Anterior circulation: Both ICA siphons are patent. On the right there is mild siphon irregularity and stenosis. On the left there is supraclinoid calcified plaque and irregularity but only mild stenosis. Small left posterior communicating artery is identified. Patent carotid termini, MCA and ACA origins. Subtle irregularity at the left MCA origin, no significant stenosis. Anterior communicating artery and bilateral ACA branches are within normal limits. Right MCA M1 segment and bifurcation are patent without stenosis. Left MCA M1 segment and trifurcation are patent without stenosis. Bilateral MCA branches are patent with mild mostly M3 segment irregularity. Venous sinuses: Patent. Anatomic variants: Dominant left and highly diminutive right vertebral arteries. The left supplies the basilar and the right functionally terminates in PICA. Review of the MIP images confirms the above findings IMPRESSION: 1. Positive for Right PCA occlusion at the P1/P2 segment junction. 2. No other large vessel occlusion. Mild for age atherosclerosis in the neck. Although more pronounced intracranial atherosclerosis affecting the 2nd and 3rd order circle-of-Willis branches. No other  significant arterial stenosis. 3. 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 This is new since the 2005 face CT, and identified in retrospect on the Brain MRI this morning which suggests inspissated/complex fluid within the cyst. Recommend follow-up with OMFS. Electronically Signed: By: Genevie Ann M.D. On: 12/20/2022 09:21   MR BRAIN WO CONTRAST  Result Date: 12/20/2022 CLINICAL DATA:  62 year old female with weakness of left face, hand and leg beginning several nights ago. Headache. EXAM: MRI HEAD WITHOUT CONTRAST TECHNIQUE: Multiplanar, multiecho pulse sequences of the brain and surrounding structures were obtained without intravenous contrast. COMPARISON:  Head CT 0547 hours today. FINDINGS: Brain: Patchy multifocal diffusion restriction in the right PCA territory: - occipital pole series 5, image 61 - central right thalamus image 71 -but also throughout the right hippocampal formation (series 5, image 66) as well as punctate involvement of nearby right periatrial white matter. These areas demonstrate 2 2/FLAIR hyperintense cytotoxic edema. No hemorrhagic transformation or mass effect. No other diffusion restriction. But chronic lacunar infarcts in the bilateral other deep gray nuclei, and patchy/confluent T2 and FLAIR hyperintensity in the bilateral corona radiata. Similar T2 heterogeneity in the pons. No cortical  encephalomalacia or chronic cerebral blood products identified. Cerebellum appears spared. No midline shift, mass effect, evidence of mass lesion, ventriculomegaly, extra-axial collection or acute intracranial hemorrhage. Cervicomedullary junction and pituitary are within normal limits. Vascular: Major intracranial vascular flow voids are preserved, distal left vertebral artery appears to be dominant. Skull and upper cervical spine: Negative. Visualized bone marrow signal is  within normal limits. Sinuses/Orbits: Rightward gaze, otherwise negative. Other: Grossly normal visible internal auditory structures. Mastoids are clear. Visible scalp and face appear negative. IMPRESSION: 1. Scattered diffusion restriction and cytotoxic edema in several areas of the Right PCA territory (including thalamus, occipital pole). Conspicuous involvement of the right hippocampus, but favor acute to subacute ischemia in light of underlying advanced small vessel disease (#3) rather than seizure/status epilepticus or drug related excitotoxicity. 2. No associated hemorrhage or mass effect. 3. 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 Electronically Signed   By: Genevie Ann M.D.   On: 12/20/2022 07:38   CT HEAD WO CONTRAST  Result Date: 12/20/2022 CLINICAL DATA:  62 year old female with weakness of left face, hand and leg beginning several nights ago. Headache. EXAM: CT HEAD WITHOUT CONTRAST TECHNIQUE: Contiguous axial images were obtained from the base of the skull through the vertex without intravenous contrast. RADIATION DOSE REDUCTION: This exam was performed according to the departmental dose-optimization program which includes automated exposure control, adjustment of the mA and/or kV according to patient size and/or use of iterative reconstruction technique. COMPARISON:  Head CT 01/05/2015. FINDINGS: Brain: Cerebral volume remains normal for age. But Patchy and confluent bilateral cerebral white matter hypodensity and moderate heterogeneity throughout the bilateral deep gray nuclei has progressed since 2016. Asymmetric involvement is greater on the right side including at the caudate (series 3, image 17) and thalamus (image 15). Furthermore, there is a small area of cortical hypodensity in the right occipital pole on series 13 which is new seems new or increased. No superimposed midline shift,  ventriculomegaly, mass effect, evidence of mass lesion, intracranial hemorrhage. No other acute cortically based infarct identified. Vascular: Calcified atherosclerosis at the skull base. No suspicious intracranial vascular hyperdensity. Skull: No acute osseous abnormality identified. Sinuses/Orbits: Left maxillary sinus periosteal thickening, but opacification of that sinus has resolved since 2016. Other Visualized paranasal sinuses and mastoids are stable and well aerated. Other: Slight leftward gaze. No other acute orbit or scalp soft tissue finding. IMPRESSION: 1. Positive for Age Indeterminate Ischemic changes in the brain, mostly small vessel type including extensive involvement of the deep gray nuclei. But also difficult to exclude a small acute or subacute occipital pole Right PCA territory infarct. 2. No acute intracranial hemorrhage or mass effect. Electronically Signed   By: Genevie Ann M.D.   On: 12/20/2022 06:05      LOS: 1 day    Flora Lipps, MD [facility] Available via Epic secure chat 7am-7pm After these hours, please refer to coverage provider listed on [email or web address] 12/21/2022, 1:30 PM

## 2022-12-21 NOTE — Transfer of Care (Signed)
Immediate Anesthesia Transfer of Care Note  Patient: Ashlee Carlson  Procedure(s) Performed: TRANSESOPHAGEAL ECHOCARDIOGRAM (TEE) BUBBLE STUDY  Patient Location: Endoscopy Unit  Anesthesia Type:MAC  Level of Consciousness: drowsy and patient cooperative  Airway & Oxygen Therapy: Patient Spontanous Breathing  Post-op Assessment: Report given to RN, Post -op Vital signs reviewed and stable, and Patient moving all extremities X 4  Post vital signs: Reviewed and stable  Last Vitals:  Vitals Value Taken Time  BP 147/65 12/21/22 1302  Temp    Pulse 86 12/21/22 1302  Resp 30 12/21/22 1302  SpO2 95 % 12/21/22 1302  Vitals shown include unvalidated device data.  Last Pain:  Vitals:   12/21/22 1158  TempSrc: Temporal  PainSc:          Complications: No notable events documented.

## 2022-12-21 NOTE — Progress Notes (Signed)
OT Cancellation Note  Patient Details Name: Ashlee Carlson MRN: [id number] DOB: 1960/12/16   Cancelled Treatment:    Reason Eval/Treat Not Completed: Patient at procedure or test/ unavailable. Will follow up and see as able.   Jolaine Artist, OT Acute Rehabilitation Services Office [phone]   [name] 12/21/2022, 12:40 PM

## 2022-12-21 NOTE — Anesthesia Preprocedure Evaluation (Addendum)
Anesthesia Evaluation  Patient identified by MRN, date of birth, ID band Patient awake    Reviewed: Allergy & Precautions, NPO status , Patient's Chart, lab work & pertinent test results  History of Anesthesia Complications Negative for: history of anesthetic complications  Airway Mallampati: II  TM Distance: >3 FB Neck ROM: Full    Dental  (+) Dental Advisory Given   Pulmonary Current Smoker and Patient abstained from smoking.   Pulmonary exam normal        Cardiovascular hypertension, Pt. on medications Normal cardiovascular exam     Neuro/Psych CVA, Residual Symptoms  negative psych ROS   GI/Hepatic negative GI ROS, Neg liver ROS,,,  Endo/Other  negative endocrine ROS    Renal/GU negative Renal ROS     Musculoskeletal negative musculoskeletal ROS (+)    Abdominal   Peds  Hematology negative hematology ROS (+)   Anesthesia Other Findings   Reproductive/Obstetrics                             Anesthesia Physical Anesthesia Plan  ASA: 4  Anesthesia Plan: MAC   Post-op Pain Management: Minimal or no pain anticipated   Induction:   PONV Risk Score and Plan: 1 and Propofol infusion and Treatment may vary due to age or medical condition  Airway Management Planned: Nasal Cannula and Natural Airway  Additional Equipment: None  Intra-op Plan:   Post-operative Plan:   Informed Consent: I have reviewed the patients History and Physical, chart, labs and discussed the procedure including the risks, benefits and alternatives for the proposed anesthesia with the patient or authorized representative who has indicated his/her understanding and acceptance.       Plan Discussed with: CRNA and Anesthesiologist  Anesthesia Plan Comments:         Anesthesia Quick Evaluation

## 2022-12-21 NOTE — Op Note (Signed)
INDICATIONS: stroke  PROCEDURE:   Informed consent was obtained prior to the procedure. The risks, benefits and alternatives for the procedure were discussed and the patient comprehended these risks.  Risks include, but are not limited to, cough, sore throat, vomiting, nausea, somnolence, esophageal and stomach trauma or perforation, bleeding, low blood pressure, aspiration, pneumonia, infection, trauma to the teeth and death.    After a procedural time-out, the oropharynx was anesthetized with 20% benzocaine spray.   During this procedure the patient was administered IV propofol by Anesthesiology The transesophageal probe was inserted in the esophagus and stomach without difficulty and multiple views were obtained.  The patient was kept under observation until the patient left the procedure room.  The patient left the procedure room in stable condition.   Agitated microbubble saline contrast was administered.  COMPLICATIONS:    There were no immediate complications.  FINDINGS:  Mild LVH, otherwise normal echo. No intracardiac masses/thrombi/vegetations, no aortic atherosclerosis and no shunt by saline contrast injection.  RECOMMENDATIONS:     No cardiac or aortic source of embolism is identified.  Time Spent Directly with the Patient:  30 minutes   Jenaye Rickert 12/21/2022, 12:58 PM

## 2022-12-21 NOTE — Hospital Course (Signed)
Ashlee Carlson is a 62 y.o. female with past medical history of hypertension, shingles presented to hospital with left-sided numbness and weakness that started 3 days back which was progressive in nature with difficulty walking and holding stuff in her hand.  In the ED patient had elevated blood pressure.  CT of the head was done which showed  age-indeterminate ischemic changes in the brain mostly small vessel type including extensive involvement of the deep gray matter.  Neurology was consulted.  Patient was not a candidate for tPA because of the timeline.  Labs were within normal range.  Subsequently MRI of the brain was done which showed  scattered diffusion restriction and cytotoxic edema in several areas of the right PCA territory with possible involvement of the right hippocampus and advanced chronic small vessel disease bilaterally.  Patient was then started on aspirin and Plavix per neurology recommendations and was admitted hospital for stroke workup.  Assessment and plan.  Acute CVA CT with concern for stroke and MRI with scattered diffusion restriction in the right PCA territory.  Not a candidate for tPA due to being out of the therapeutic window.  On aspirin and Plavix and Crestor..  Risk factors include recent cocaine use and uncontrolled hypertension.  CT angiogram of the head and neck positive for right PCA occlusion at P1 P2 segment no other large vessel occlusion.  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  Recommended outpatient follow-up with oral maxillary facial surgery.  Hemoglobin A1c was 6.1.  Lipid panel noted with triglyceride of 175, HDL 65, LDL 69.  Urine drug screen was positive for cocaine.  Hypercoagulable workup has been sent.  Follow neurology recommendation.  Check PT OT speech.   Hypertensive urgency Could be secondary to cocaine usage.  Elevated on presentation.  Amlodipine dose was increased to 10 mg.  On as  needed hydralazine.  Will consider adding second agent if not under good control.    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 We need to follow-up with oral maxillofacial surgery as outpatient.   Hypertriglyceridemia Has been started on fenofibrate.   Cocaine abuse Urine drug screen was positive for cocaine.  Counseling done.

## 2022-12-21 NOTE — TOC Initial Note (Signed)
Transition of Care [facility]) - Initial/Assessment Note    Patient Details  Name: Ashlee Carlson MRN: [id number] Date of Birth: 1961/06/12  [facility]) CM/SW Contact:    Benard Halsted, LCSW Phone Number: 12/21/2022, 4:11 PM  Clinical Narrative:                 CSW received consult that patient had questions about senior apartments. CSW met with patient and provided resources for housing and other community resources for seniors. Patient shared she lives alone but just completed HCPOA paperwork with her daughter that she will get notarized. 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 No other needs identified at this time.   Expected Discharge Plan: OP Rehab Barriers to Discharge: Continued Medical Work up   Patient Goals and CMS Choice Patient states their goals for this hospitalization and ongoing recovery are:: Feel better          Expected Discharge Plan and Services In-house Referral: Clinical Social Work Discharge Planning Services: CM Consult   Living arrangements for the past 2 months: Single Family Home                                      Prior Living Arrangements/Services Living arrangements for the past 2 months: Single Family Home Lives with:: Self Patient language and need for interpreter reviewed:: Yes Do you feel safe going back to the place where you live?: Yes      Need for Family Participation in Patient Care: No (Comment) Care giver support system in place?: Yes (comment)   Criminal Activity/Legal Involvement Pertinent to Current Situation/Hospitalization: No - Comment as needed  Activities of Daily Living Home Assistive Devices/Equipment: Cane (specify quad or straight), Eyeglasses (straight cane at times for long distances; reading glasses) ADL Screening (condition at time of admission) Patient's cognitive ability adequate to safely complete daily activities?: Yes Is the patient deaf or have  difficulty hearing?: No Does the patient have difficulty seeing, even when wearing glasses/contacts?: No Does the patient have difficulty concentrating, remembering, or making decisions?: No Patient able to express need for assistance with ADLs?: Yes Does the patient have difficulty dressing or bathing?: Yes Independently performs ADLs?: No Communication: Independent Dressing (OT): Needs assistance Grooming: Needs assistance Feeding: Independent Bathing: Needs assistance Toileting: Needs assistance In/Out Bed: Needs assistance Walks in Home: Needs assistance Does the patient have difficulty walking or climbing stairs?: Yes Weakness of Legs: Left Weakness of Arms/Hands: Left  Permission Sought/Granted                  Emotional Assessment Appearance:: Appears stated age Attitude/Demeanor/Rapport: Engaged Affect (typically observed): Accepting, Pleasant, Appropriate Orientation: : Oriented to Self, Oriented to  Time, Oriented to Place, Oriented to Situation Alcohol / Substance Use: Illicit Drugs Psych Involvement: No (comment)  Admission diagnosis:  Weakness [R53.1] CVA (cerebral vascular accident) [facility]) [I63.9] Cerebrovascular accident (CVA), unspecified mechanism ([place]) [I63.9] Patient Active Problem List   Diagnosis Date Noted   Cryptogenic stroke ([place]) 12/21/2022   CVA (cerebral vascular accident) ([place]) 12/20/2022   Essential hypertension 12/20/2022   Hypertensive urgency 12/20/2022   Bone cyst 12/20/2022   Hypertriglyceridemia 12/20/2022   Cocaine abuse ([place]) 12/20/2022   PCP:  Patient, No Pcp Per Pharmacy:   [facility] [place], [place] [address] [address] Phone: [phone] Fax: [place] [address] [place] [address] Phone: [phone] Fax: [phone]     Social Determinants of Health  (SDOH) Social History: SDOH Screenings   Food Insecurity: No Food Insecurity (12/20/2022)  Housing: Low Risk  (12/20/2022)  Transportation Needs: No Transportation Needs (12/20/2022)  Utilities: Not At Risk (12/20/2022)  Tobacco Use: High Risk (12/21/2022)   SDOH Interventions:     Readmission Risk Interventions     No data to display

## 2022-12-21 NOTE — Progress Notes (Signed)
Transported off unit to Endo with Endo staff member via wheelchair.

## 2022-12-22 ENCOUNTER — Other Ambulatory Visit (HOSPITAL_COMMUNITY): Payer: Self-pay

## 2022-12-22 ENCOUNTER — Other Ambulatory Visit: Payer: Self-pay | Admitting: Physician Assistant

## 2022-12-22 DIAGNOSIS — I160 Hypertensive urgency: Secondary | ICD-10-CM | POA: Diagnosis not present

## 2022-12-22 DIAGNOSIS — I63531 Cerebral infarction due to unspecified occlusion or stenosis of right posterior cerebral artery: Secondary | ICD-10-CM

## 2022-12-22 DIAGNOSIS — M8560 Other cyst of bone, unspecified site: Secondary | ICD-10-CM | POA: Diagnosis not present

## 2022-12-22 DIAGNOSIS — E781 Pure hyperglyceridemia: Secondary | ICD-10-CM | POA: Diagnosis not present

## 2022-12-22 LAB — HOMOCYSTEINE: Homocysteine: 14.9 umol/L — ABNORMAL HIGH (ref 0.0–14.5)

## 2022-12-22 MED ORDER — ROSUVASTATIN CALCIUM 10 MG PO TABS
10.0000 mg | ORAL_TABLET | Freq: Every day | ORAL | 2 refills | Status: DC
  Filled 2022-12-22: qty 30, 30d supply, fill #0

## 2022-12-22 MED ORDER — CLOPIDOGREL BISULFATE 75 MG PO TABS
75.0000 mg | ORAL_TABLET | Freq: Every day | ORAL | 0 refills | Status: DC
  Filled 2022-12-22: qty 90, 90d supply, fill #0

## 2022-12-22 MED ORDER — LOSARTAN POTASSIUM 50 MG PO TABS
50.0000 mg | ORAL_TABLET | Freq: Every day | ORAL | 2 refills | Status: DC
  Filled 2022-12-22: qty 30, 30d supply, fill #0

## 2022-12-22 MED ORDER — AMLODIPINE BESYLATE 10 MG PO TABS
10.0000 mg | ORAL_TABLET | Freq: Every day | ORAL | 0 refills | Status: DC
  Filled 2022-12-22 (×2): qty 90, 90d supply, fill #0

## 2022-12-22 MED ORDER — ASPIRIN 81 MG PO TBEC
81.0000 mg | DELAYED_RELEASE_TABLET | Freq: Every day | ORAL | 12 refills | Status: DC
  Filled 2022-12-22: qty 30, 30d supply, fill #0

## 2022-12-22 MED ORDER — CLOPIDOGREL BISULFATE 75 MG PO TABS
75.0000 mg | ORAL_TABLET | Freq: Every day | ORAL | 0 refills | Status: AC
  Filled 2022-12-22: qty 90, 90d supply, fill #0

## 2022-12-22 NOTE — Progress Notes (Signed)
30 day event monitor post CVA  Dr. Percival Spanish to read

## 2022-12-22 NOTE — Progress Notes (Signed)
Physical Therapy Treatment Patient Details Name: Ashlee Carlson MRN: [id number] DOB: May 22, 1961 Today's Date: 12/22/2022   History of Present Illness Pt is a 62 y/o female presenting on 3/21 with L sided numbness and weakness x 2 days. CT reveals several subacute areas, including occipital lobe. MRI with scattered diffusion restriction and cytotoxic edema in R PCA territory (including thalamus, occipital pole). PMH includes: HTN, shingles.    PT Comments    Pt tolerated today's session well, utilizing RW throughout transfers and ambulation. Pt performed stair trial before discharge home today, able to recall technique from yesterday's education, negotiating stairs with minG for safety and balance and use of R rail like pt has at home. Educated pt on OPPT recommendations and pt agreeable. Acute PT will continue to follow up with pt to progress mobility during admission, discharge recommendations remain appropriate.     Recommendations for follow up therapy are one component of a multi-disciplinary discharge planning process, led by the attending physician.  Recommendations may be updated based on patient status, additional functional criteria and insurance authorization.  Follow Up Recommendations  Outpatient PT     Assistance Recommended at Discharge Intermittent Supervision/Assistance  Patient can return home with the following A little help with walking and/or transfers;Help with stairs or ramp for entrance;Assist for transportation   Equipment Recommendations  Rolling walker (2 wheels)    Recommendations for Other Services       Precautions / Restrictions Precautions Precautions: Fall Restrictions Weight Bearing Restrictions: No     Mobility  Bed Mobility Overal bed mobility: Needs Assistance Bed Mobility: Supine to Sit, Sit to Supine     Supine to sit: Supervision, HOB elevated Sit to supine: Supervision, HOB elevated   General bed mobility comments: supervision for  safety and line management    Transfers Overall transfer level: Needs assistance Equipment used: Rolling walker (2 wheels) Transfers: Sit to/from Stand Sit to Stand: Supervision           General transfer comment: supervision for safety and line management, cued for proper hand placement    Ambulation/Gait Ambulation/Gait assistance: Supervision Gait Distance (Feet): 250 Feet Assistive device: Rolling walker (2 wheels), None Gait Pattern/deviations: Decreased stride length, Step-through pattern, Drifts right/left, Decreased dorsiflexion - left Gait velocity: decreased     General Gait Details: pt cued for posture and increased step length with gait, supervision for safety and line management. Noted one mild imbalance but pt self correcting   Stairs Stairs: Yes Stairs assistance: Min guard Stair Management: One rail Right, Step to pattern, Forwards Number of Stairs: 3 General stair comments: limited by IV but managing 3 steps with minG for safety and cueing for proper technique, pt recalling from education during previous session. Encouraged pt to have family assist with first trial, pt verbalizing understanding and feeling comfortable negotiating at home   Wheelchair Mobility    Modified Rankin (Stroke Patients Only) Modified Rankin (Stroke Patients Only) Pre-Morbid Rankin Score: No symptoms Modified Rankin: Moderate disability     Balance Overall balance assessment: Needs assistance Sitting-balance support: No upper extremity supported, Feet supported Sitting balance-Leahy Scale: Good     Standing balance support: Bilateral upper extremity supported, During functional activity Standing balance-Leahy Scale: Poor Standing balance comment: reliant on UE support for static standing and ambulation                            Cognition Arousal/Alertness: Awake/alert Behavior During Therapy: [facility] for tasks  assessed/performed Overall Cognitive Status:  Within Functional Limits for tasks assessed                                 General Comments: pleasant throughout session        Exercises      General Comments General comments (skin integrity, edema, etc.): VSS on room air      Pertinent Vitals/Pain Pain Assessment Pain Assessment: Faces Faces Pain Scale: Hurts little more Pain Location: head Pain Descriptors / Indicators: Aching Pain Intervention(s): Monitored during session, Limited activity within patient's tolerance    Home Living                          Prior Function            PT Goals (current goals can now be found in the care plan section) Acute Rehab PT Goals Patient Stated Goal: go home PT Goal Formulation: With patient Time For Goal Achievement: 01/04/23 Potential to Achieve Goals: Good Progress towards PT goals: Progressing toward goals    Frequency    Min 4X/week      PT Plan Current plan remains appropriate    Co-evaluation              AM-PAC PT "6 Clicks" Mobility   Outcome Measure  Help needed turning from your back to your side while in a flat bed without using bedrails?: A Little Help needed moving from lying on your back to sitting on the side of a flat bed without using bedrails?: A Little Help needed moving to and from a bed to a chair (including a wheelchair)?: A Little Help needed standing up from a chair using your arms (e.g., wheelchair or bedside chair)?: A Little Help needed to walk in hospital room?: A Little Help needed climbing 3-5 steps with a railing? : A Little 6 Click Score: 18    End of Session Equipment Utilized During Treatment: Gait belt Activity Tolerance: Patient tolerated treatment well Patient left: in bed;with call bell/phone within reach;with nursing/sitter in room Nurse Communication: Mobility status PT Visit Diagnosis: Unsteadiness on feet (R26.81);Other abnormalities of gait and mobility (R26.89);Hemiplegia and  hemiparesis Hemiplegia - Right/Left: Left Hemiplegia - dominant/non-dominant: Non-dominant Hemiplegia - caused by: Cerebral infarction     Time: [id number] PT Time Calculation (min) (ACUTE ONLY): 16 min  Charges:  $Gait Training: 8-22 mins                     Charlynne Cousins, PT DPT Acute Rehabilitation Services Office [phone]    Luvenia Heller 12/22/2022, 2:15 PM

## 2022-12-22 NOTE — Discharge Summary (Signed)
Physician Discharge Summary  JAELYNE LOISELLE [id number] DOB: 1961-01-03 DOA: 12/20/2022  PCP: Patient, No Pcp Per  Admit date: 12/20/2022 Discharge date: 12/22/2022  Admitted From: Home  Discharge disposition: Home  Recommendations for Outpatient Follow-Up:   Follow up with your primary care provider in one week.  Check CBC, BMP, magnesium in the next visit Follow-up with [facility] in 4 weeks.  Office to schedule an appointment. Will need 30-day event monitoring as outpatient.  Cardiology will coordinate with the patient.  Discharge Diagnosis:   Principal Problem:   CVA (cerebral vascular accident) [facility]) Active Problems:   Hypertensive urgency   Bone cyst   Hypertriglyceridemia   Cocaine abuse ([place])   Cryptogenic stroke [facility]   Discharge Condition: Improved.  Diet recommendation: Low sodium, heart healthy.    Wound care: None.  Code status: Full.   History of Present Illness:   JENEEN COLAO is a 62 y.o. female with past medical history of hypertension, shingles presented to hospital with left-sided numbness and weakness that started 3 days prior to presentation which was associated with progressive difficulty walking.  In the ED patient had elevated blood pressure.  CT of the head was done which showed  age-indeterminate ischemic changes in the brain mostly small vessel type including extensive involvement of the deep gray matter.  Neurology was consulted.  Patient was not a candidate for tPA because of the timeline.  Labs were within normal range.  Subsequently MRI of the brain was done which showed  scattered diffusion restriction and cytotoxic edema in several areas of the right PCA territory with possible involvement of the right hippocampus and advanced chronic small vessel disease bilaterally.  Patient was then started on aspirin and Plavix per neurology recommendations and was admitted hospital for stroke workup.   Hospital Course:    Following conditions were addressed during hospitalization as listed below,  Acute CVA CT with concern for stroke and MRI with scattered diffusion restriction in the right PCA territory.  Not a candidate for tPA due to being out of the therapeutic window.  On aspirin, Plavix and Crestor.  Risk factors include recent cocaine use and uncontrolled hypertension.  CT angiogram of the head and neck positive for right PCA occlusion at P1 P2 segment no other large vessel occlusion. Hemoglobin A1c was 6.1.  Lipid panel noted with triglyceride of 175, HDL 65, LDL 69.  Urine drug screen was positive for cocaine.  Hypercoagulable workup has been sent.  EEG was negative.  Patient will need 30-day cardiac event monitoring as outpatient .   PT recommends outpatient PT.  Neurology recommends aspirin Plavix for 3 months then aspirin alone.  Communicated with cardiology for 30-day monitoring as outpatient.    Hypertensive urgency Could be secondary to cocaine usage.  Elevated on presentation.  Amlodipine dose was increased to 10 mg and patient has been started on losartan 50mg  daily.  Will need further adjustment of blood pressure as outpatient.  Patient was emphasized the need for adequate blood pressure monitoring and adjustment with her medications to decrease risk of further stroke.   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 We need to follow-up with oral maxillofacial surgery as outpatient.  Patient denies any symptoms.   Hypertriglyceridemia Been started on statins.  Will continue on discharge.   Cocaine abuse Urine drug screen was positive for cocaine.  Counseling done.  Patient states that somebody had left something like a salt which she took mistakenly.  Disposition.  At this  time, patient is stable for disposition home with outpatient PCP and neurology follow-up  Medical Consultants:   Neurology  Procedures:    None Subjective:   Today, patient was seen and examined at bedside.  Denies any  dizziness, lightheadedness shortness of breath chest pain.  Still has mild numbness but improved.  Discharge Exam:   Vitals:   12/22/22 0800 12/22/22 0851  BP: (!) 164/75   Pulse: 81 92  Resp: 17 18  Temp: 97.6 F (36.4 C)   SpO2: 100% 96%   Vitals:   12/22/22 0300 12/22/22 0355 12/22/22 0800 12/22/22 0851  BP:  (!) 161/80 (!) 164/75   Pulse: 84  81 92  Resp: 19  17 18   Temp:  98.6 F (37 C) 97.6 F (36.4 C)   TempSrc:  Oral Oral   SpO2: 98%  100% 96%  Weight:      Height:       Body mass index is 36.44 kg/m.  General: Alert awake, not in obvious distress, obese HENT: pupils equally reacting to light,  No scleral pallor or icterus noted. Oral mucosa is moist.  Chest:  Clear breath sounds.  Diminished breath sounds bilaterally. No crackles or wheezes.  CVS: S1 &S2 heard. No murmur.  Regular rate and rhythm. Abdomen: Soft, nontender, nondistended.  Bowel sounds are heard.   Extremities: No cyanosis, clubbing or edema.  Peripheral pulses are palpable. Psych: Alert, awake and oriented, normal mood CNS:  No cranial nerve deficits.  Left-sided mild weakness. Skin: Warm and dry.  No rashes noted.  The results of significant diagnostics from this hospitalization (including imaging, microbiology, ancillary and laboratory) are listed below for reference.     Diagnostic Studies:   ECHO TEE  Result Date: 12/21/2022    TRANSESOPHOGEAL ECHO REPORT   Patient Name:   GUADALUPE MCFARLANE Date of Exam: 12/21/2022 Medical Rec #:  [id number]        Height:       65.0 in Accession #:    [id number]       Weight:       212.0 lb Date of Birth:  [birth year]       BSA:          2.028 m Patient Age:    4 years         BP:           168/77 mmHg Patient Gender: F                HR:           98 bpm. Exam Location:  Inpatient Procedure: Transesophageal Echo, Cardiac Doppler and Color Doppler Indications:    Stroke  History:        Patient has no prior history of Echocardiogram examinations.                  Cocaine abuse and Stroke; Risk Factors:Hypertension,                 Dyslipidemia and Current Smoker.  Sonographer:    Wilkie Aye RVT RCS Referring Phys: [id number] [place] PROCEDURE: The transesophogeal probe was passed without difficulty through the esophogus of the patient. Sedation performed by different physician. The patient's vital signs; including heart rate, blood pressure, and oxygen saturation; remained stable throughout the procedure. The patient developed no complications during the procedure.  IMPRESSIONS  1. Left ventricular ejection fraction, by estimation, is 60 to 65%. The left ventricle has normal function. The  left ventricle has no regional wall motion abnormalities. There is moderate concentric left ventricular hypertrophy.  2. Right ventricular systolic function is normal. The right ventricular size is normal.  3. No left atrial/left atrial appendage thrombus was detected.  4. The mitral valve is normal in structure. No evidence of mitral valve regurgitation. No evidence of mitral stenosis.  5. The aortic valve is tricuspid. Aortic valve regurgitation is not visualized. No aortic stenosis is present.  6. The inferior vena cava is normal in size with greater than 50% respiratory variability, suggesting right atrial pressure of 3 mmHg. Conclusion(s)/Recommendation(s): No LA/LAA thrombus identified. Negative bubble study for interatrial shunt. No intracardiac source of embolism detected on this on this transesophageal echocardiogram. FINDINGS  Left Ventricle: Left ventricular ejection fraction, by estimation, is 60 to 65%. The left ventricle has normal function. The left ventricle has no regional wall motion abnormalities. The left ventricular internal cavity size was normal in size. There is  moderate concentric left ventricular hypertrophy. Right Ventricle: The right ventricular size is normal. No increase in right ventricular wall thickness. Right ventricular systolic function is normal.  Left Atrium: Left atrial size was normal in size. No left atrial/left atrial appendage thrombus was detected. Right Atrium: Right atrial size was normal in size. Pericardium: There is no evidence of pericardial effusion. Mitral Valve: The mitral valve is normal in structure. No evidence of mitral valve regurgitation. No evidence of mitral valve stenosis. Tricuspid Valve: The tricuspid valve is normal in structure. Tricuspid valve regurgitation is not demonstrated. No evidence of tricuspid stenosis. Aortic Valve: The aortic valve is tricuspid. Aortic valve regurgitation is not visualized. No aortic stenosis is present. Pulmonic Valve: The pulmonic valve was normal in structure. Pulmonic valve regurgitation is not visualized. No evidence of pulmonic stenosis. Aorta: The aortic root is normal in size and structure. Venous: The inferior vena cava is normal in size with greater than 50% respiratory variability, suggesting right atrial pressure of 3 mmHg. IAS/Shunts: No atrial level shunt detected by color flow Doppler. Sanda Klein MD Electronically signed by Sanda Klein MD Signature Date/Time: 12/21/2022/1:46:33 PM    Final    VAS [place] LOWER EXTREMITY VENOUS (DVT)  Result Date: 12/20/2022  Lower Venous DVT Study Patient Name:  LORALAI SOUTHWARD  Date of Exam:   12/20/2022 Medical Rec #: [id number]         Accession #:    [id number] Date of Birth: 11-20-1960        Patient Gender: F Patient Age:   62 years Exam Location:  [facility] Procedure:      VAS [place] LOWER EXTREMITY VENOUS (DVT) Referring Phys: Cornelius Moras XU --------------------------------------------------------------------------------  Other Indications: Embolic stroke. Comparison Study: No previous study. Performing Technologist: McKayla Maag RVT, [place]  Examination Guidelines: A complete evaluation includes B-mode imaging, spectral Doppler, color Doppler, and power Doppler as needed of all accessible portions of each vessel. Bilateral testing is  considered an integral part of a complete examination. Limited examinations for reoccurring indications may be performed as noted. The reflux portion of the exam is performed with the patient in reverse Trendelenburg.  +---------+---------------+---------+-----------+----------+--------------+ RIGHT    CompressibilityPhasicitySpontaneityPropertiesThrombus Aging +---------+---------------+---------+-----------+----------+--------------+ CFV      Full           Yes      Yes                                 +---------+---------------+---------+-----------+----------+--------------+ SFJ  Full                                                        +---------+---------------+---------+-----------+----------+--------------+ FV Prox  Full                                                        +---------+---------------+---------+-----------+----------+--------------+ FV Mid   Full                                                        +---------+---------------+---------+-----------+----------+--------------+ FV DistalFull                                                        +---------+---------------+---------+-----------+----------+--------------+ PFV      Full                                                        +---------+---------------+---------+-----------+----------+--------------+ POP      Full           Yes      Yes                                 +---------+---------------+---------+-----------+----------+--------------+ PTV      Full                                                        +---------+---------------+---------+-----------+----------+--------------+ PERO     Full                                                        +---------+---------------+---------+-----------+----------+--------------+   +---------+---------------+---------+-----------+----------+--------------+ LEFT      CompressibilityPhasicitySpontaneityPropertiesThrombus Aging +---------+---------------+---------+-----------+----------+--------------+ CFV      Full           Yes      Yes                                 +---------+---------------+---------+-----------+----------+--------------+ SFJ      Full                                                        +---------+---------------+---------+-----------+----------+--------------+  FV Prox  Full                                                        +---------+---------------+---------+-----------+----------+--------------+ FV Mid   Full                                                        +---------+---------------+---------+-----------+----------+--------------+ FV DistalFull                                                        +---------+---------------+---------+-----------+----------+--------------+ PFV      Full                                                        +---------+---------------+---------+-----------+----------+--------------+ POP      Full           Yes      Yes                                 +---------+---------------+---------+-----------+----------+--------------+ PTV      Full                                                        +---------+---------------+---------+-----------+----------+--------------+ PERO     Full                                                        +---------+---------------+---------+-----------+----------+--------------+     Summary: BILATERAL: - No evidence of deep vein thrombosis seen in the lower extremities, bilaterally. - No evidence of superficial venous thrombosis in the lower extremities, bilaterally. -No evidence of popliteal cyst, bilaterally.   *See table(s) above for measurements and observations. Electronically signed by Jamelle Haring on 12/20/2022 at 3:46:45 PM.    Final    CT ANGIO HEAD NECK W WO CM  Addendum Date: 12/20/2022    ADDENDUM REPORT: 12/20/2022 09:39 ADDENDUM: Study discussed by telephone with Dr. Fuller Plan on 12/20/2022 at 0927 hours. Electronically Signed   By: Genevie Ann M.D.   On: 12/20/2022 09:39   Result Date: 12/20/2022 CLINICAL DATA:  62 year old female with scattered right PCA territory infarction on brain MRI this morning. Left-sided hand face and leg symptoms beginning several nights ago. Headache. EXAM: CT ANGIOGRAPHY HEAD AND NECK TECHNIQUE: Multidetector CT imaging of the head and neck was performed using the standard protocol during bolus administration of intravenous contrast. Multiplanar CT image reconstructions and MIPs were obtained  to evaluate the vascular anatomy. Carotid stenosis measurements (when applicable) are obtained utilizing NASCET criteria, using the distal internal carotid diameter as the denominator. RADIATION DOSE REDUCTION: This exam was performed according to the departmental dose-optimization program which includes automated exposure control, adjustment of the mA and/or kV according to patient size and/or use of iterative reconstruction technique. CONTRAST:  19mL OMNIPAQUE IOHEXOL 350 MG/ML SOLN COMPARISON:  CT head and MRI brain earlier today. Previous face CT 12/05/2003. 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 Narrow zone of transition. This expansile bone lesion is associated with the roots of the left maxillary incisors, the lateral incisor of which is abnormal. No other acute osseous abnormality identified with mild for age cervical spine degeneration. Upper chest: Negative. Other neck: No acute soft tissue finding identified in the neck. Aortic arch: 3 vessel arch configuration. No significant arch atherosclerosis. Right carotid system: Negative aside from mild  tortuosity. Left carotid system: Negative aside from mild tortuosity. Vertebral arteries: Minimal proximal right subclavian artery plaque without stenosis. Diminutive right vertebral artery origin is patent without stenosis. Right vertebral is non dominant and remains diminutive but patent to the skull base. Proximal left subclavian artery and left vertebral artery origin are normal. The left vertebral is dominant, mildly hyperplastic and tortuous to the skull base. CTA HEAD Posterior circulation: No left vertebral artery plaque or stenosis to the basilar. Non dominant right vertebral functionally terminates in PICA. Patent basilar artery without stenosis. Patent SCA origins and basilar tip. Both PCA origins are patent, but the right PCA is occluded in the P1/P2 segment junction on series 10, image 19. Poor distal enhancement. Contralateral left PCA branches are patent although with widespread mild PCA irregularity on series 12, image 23. Anterior circulation: Both ICA siphons are patent. On the right there is mild siphon irregularity and stenosis. On the left there is supraclinoid calcified plaque and irregularity but only mild stenosis. Small left posterior communicating artery is identified. Patent carotid termini, MCA and ACA origins. Subtle irregularity at the left MCA origin, no significant stenosis. Anterior communicating artery and bilateral ACA branches are within normal limits. Right MCA M1 segment and bifurcation are patent without stenosis. Left MCA M1 segment and trifurcation are patent without stenosis. Bilateral MCA branches are patent with mild mostly M3 segment irregularity. Venous sinuses: Patent. Anatomic variants: Dominant left and highly diminutive right vertebral arteries. The left supplies the basilar and the right functionally terminates in PICA. Review of the MIP images confirms the above findings IMPRESSION: 1. Positive for Right PCA occlusion at the P1/P2 segment junction. 2. No other  large vessel occlusion. Mild for age atherosclerosis in the neck. Although more pronounced intracranial atherosclerosis affecting the 2nd and 3rd order circle-of-Willis branches. No other significant arterial stenosis. 3. 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 This is new since the 2005 face CT, and identified in retrospect on the Brain MRI this morning which suggests inspissated/complex fluid within the cyst. Recommend follow-up with OMFS. Electronically Signed: By: Genevie Ann M.D. On: 12/20/2022 09:21   MR BRAIN WO CONTRAST  Result Date: 12/20/2022 CLINICAL DATA:  62 year old female with weakness of left face, hand and leg beginning several nights ago. Headache. EXAM:  MRI HEAD WITHOUT CONTRAST TECHNIQUE: Multiplanar, multiecho pulse sequences of the brain and surrounding structures were obtained without intravenous contrast. COMPARISON:  Head CT 0547 hours today. FINDINGS: Brain: Patchy multifocal diffusion restriction in the right PCA territory: - occipital pole series 5, image 61 - central right thalamus image 71 -but also throughout the right hippocampal formation (series 5, image 66) as well as punctate involvement of nearby right periatrial white matter. These areas demonstrate 2 2/FLAIR hyperintense cytotoxic edema. No hemorrhagic transformation or mass effect. No other diffusion restriction. But chronic lacunar infarcts in the bilateral other deep gray nuclei, and patchy/confluent T2 and FLAIR hyperintensity in the bilateral corona radiata. Similar T2 heterogeneity in the pons. No cortical encephalomalacia or chronic cerebral blood products identified. Cerebellum appears spared. No midline shift, mass effect, evidence of mass lesion, ventriculomegaly, extra-axial collection or acute intracranial hemorrhage. Cervicomedullary junction and pituitary are within normal limits.  Vascular: Major intracranial vascular flow voids are preserved, distal left vertebral artery appears to be dominant. Skull and upper cervical spine: Negative. Visualized bone marrow signal is within normal limits. Sinuses/Orbits: Rightward gaze, otherwise negative. Other: Grossly normal visible internal auditory structures. Mastoids are clear. Visible scalp and face appear negative. IMPRESSION: 1. Scattered diffusion restriction and cytotoxic edema in several areas of the Right PCA territory (including thalamus, occipital pole). Conspicuous involvement of the right hippocampus, but favor acute to subacute ischemia in light of underlying advanced small vessel disease (#3) rather than seizure/status epilepticus or drug related excitotoxicity. 2. No associated hemorrhage or mass effect. 3. 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 Electronically Signed   By: Genevie Ann M.D.   On: 12/20/2022 07:38   CT HEAD WO CONTRAST  Result Date: 12/20/2022 CLINICAL DATA:  62 year old female with weakness of left face, hand and leg beginning several nights ago. Headache. EXAM: CT HEAD WITHOUT CONTRAST TECHNIQUE: Contiguous axial images were obtained from the base of the skull through the vertex without intravenous contrast. RADIATION DOSE REDUCTION: This exam was performed according to the departmental dose-optimization program which includes automated exposure control, adjustment of the mA and/or kV according to patient size and/or use of iterative reconstruction technique. COMPARISON:  Head CT 01/05/2015. FINDINGS: Brain: Cerebral volume remains normal for age. But Patchy and confluent bilateral cerebral white matter hypodensity and moderate heterogeneity throughout the bilateral deep gray nuclei has progressed since 2016. Asymmetric involvement is greater on the right side including at the caudate (series 3, image 17) and thalamus  (image 15). Furthermore, there is a small area of cortical hypodensity in the right occipital pole on series 13 which is new seems new or increased. No superimposed midline shift, ventriculomegaly, mass effect, evidence of mass lesion, intracranial hemorrhage. No other acute cortically based infarct identified. Vascular: Calcified atherosclerosis at the skull base. No suspicious intracranial vascular hyperdensity. Skull: No acute osseous abnormality identified. Sinuses/Orbits: Left maxillary sinus periosteal thickening, but opacification of that sinus has resolved since 2016. Other Visualized paranasal sinuses and mastoids are stable and well aerated. Other: Slight leftward gaze. No other acute orbit or scalp soft tissue finding. IMPRESSION: 1. Positive for Age Indeterminate Ischemic changes in the brain, mostly small vessel type including extensive involvement of the deep gray nuclei. But also difficult to exclude a small acute or subacute occipital pole Right PCA territory infarct. 2. No acute intracranial hemorrhage or mass effect. Electronically Signed   By: Genevie Ann M.D.   On:  12/20/2022 06:05     Labs:   Basic Metabolic Panel: Recent Labs  Lab 12/20/22 0535 12/20/22 0545  NA 138 141  K 3.5 3.2*  CL 103 103  CO2 26  --   GLUCOSE 88 82  BUN 17 21*  CREATININE 1.12* 1.10*  CALCIUM 9.3  --    GFR Estimated Creatinine Clearance: 63.4 mL/min (A) (by C-G formula based on SCr of 1.1 mg/dL (H)). Liver Function Tests: Recent Labs  Lab 12/20/22 0535  AST 29  ALT 15  ALKPHOS 60  BILITOT 0.9  PROT 7.0  ALBUMIN 3.8   No results for input(s): "LIPASE", "AMYLASE" in the last 168 hours. No results for input(s): "AMMONIA" in the last 168 hours. Coagulation profile Recent Labs  Lab 12/20/22 0535  INR 1.0    CBC: Recent Labs  Lab 12/20/22 0535 12/20/22 0545  WBC 7.3  --   NEUTROABS 3.5  --   HGB 12.8 13.6  HCT 38.9 40.0  MCV 92.8  --   PLT 300  --    Cardiac Enzymes: No  results for input(s): "CKTOTAL", "CKMB", "CKMBINDEX", "TROPONINI" in the last 168 hours. BNP: Invalid input(s): "POCBNP" CBG: Recent Labs  Lab 12/20/22 0538  GLUCAP 83   D-Dimer No results for input(s): "DDIMER" in the last 72 hours. Hgb A1c Recent Labs    12/20/22 0825  HGBA1C 6.1*   Lipid Profile Recent Labs    12/20/22 0825  CHOL 170  HDL 65  LDLCALC 69  TRIG 179*  CHOLHDL 2.6   Thyroid function studies Recent Labs    12/20/22 0825  TSH 0.504   Anemia work up No results for input(s): "VITAMINB12", "FOLATE", "FERRITIN", "TIBC", "IRON", "RETICCTPCT" in the last 72 hours. Microbiology No results found for this or any previous visit (from the past 240 hour(s)).   Discharge Instructions:   Discharge Instructions     Ambulatory referral to Neurology   Complete by: As directed    Follow up with stroke clinic NP [facility] [facility], if not available, consider Zachery Dauer, or [place]) at [facility] in about 4 weeks. Thanks.   Diet - low sodium heart healthy   Complete by: As directed    Discharge instructions   Complete by: As directed    Follow up with   Increase activity slowly   Complete by: As directed       Allergies as of 12/22/2022   No Known Allergies      Medication List     STOP taking these medications    acyclovir 400 MG tablet Commonly known as: ZOVIRAX   lisinopril-hydrochlorothiazide 10-12.5 MG tablet Commonly known as: Zestoretic   loratadine 10 MG tablet Commonly known as: CLARITIN   meloxicam 7.5 MG tablet Commonly known as: Mobic   VITAMIN B-12 PO       TAKE these medications    acetaminophen 500 MG tablet Commonly known as: TYLENOL Take 1 tablet (500 mg total) by mouth every 6 (six) hours as needed. What changed:  when to take this reasons to take this   amLODipine 10 MG tablet Commonly known as: NORVASC Take 1 tablet (10 mg total) by mouth daily. What changed:  medication strength how much to take   aspirin EC 81  MG tablet Take 1 tablet (81 mg total) by mouth daily. Swallow whole. Start taking on: December 23, 2022   clopidogrel 75 MG tablet Commonly known as: PLAVIX Take 1 tablet (75 mg total) by mouth daily. Start taking on: December 23, 2022   hydrocortisone cream 1 % Apply to affected area 2 times daily   losartan 50 MG tablet Commonly known as: COZAAR Take 1 tablet (50 mg total) by mouth daily. Start taking on: December 23, 2022   rosuvastatin 10 MG tablet Commonly known as: CRESTOR Take 1 tablet (10 mg total) by mouth daily. Start taking on: December 23, 2022               Durable Medical Equipment  (From admission, onward)           Start     Ordered   12/22/22 0957  For home use only DME Bedside commode  Once       Question:  Patient needs a bedside commode to treat with the following condition  Answer:  Weakness   12/22/22 0956            Follow-up Information     [facility]. Schedule an appointment as soon as possible for a visit in 1 month(s).   Specialty: Neurology Why: stroke clinic Contact information: [address] [place] [facility] .   Specialty: Neurology Contact information: [address] [place] [facility] Follow up.   Specialty: Rehabilitation Why: referral made for PT and OT. They will call you in 5-10 days to set up an appointment, or you may call for faster scheduling. Contact information: [address] [id number] [place] [phone]                 Time coordinating discharge: 39 minutes  Signed:  Korrie Hofbauer  [facility] 12/22/2022, 11:48 AM

## 2022-12-22 NOTE — TOC Transition Note (Addendum)
Transition of Care [facility]) - CM/SW Discharge Note   Patient Details  Name: Ashlee Carlson MRN: [id number] Date of Birth: August 31, 1961  [facility]) CM/SW Contact:  Carles Collet, RN Phone Number: 12/22/2022, 9:57 AM   Clinical Narrative:     Damaris Schooner w patient over the phone Reviewed Dc recs.  She would like RW and 3/1, these will be deliveredto the room by [employer].  OP therapy referral made to [facility].   Final next level of care: Home/Self Care Barriers to Discharge: No Barriers Identified   Patient Goals and CMS Choice      Discharge Placement                         Discharge Plan and Services Additional resources added to the After Visit Summary for   In-house Referral: Clinical Social Work Discharge Planning Services: CM Consult            DME Arranged: 3-N-1, Gilford Rile rolling DME Agency: [facility] [employer] Date DME Agency Contacted: 12/22/22 Time DME Agency Contacted: [phone] Representative spoke with at DME Agency: [place] Determinants of Health ([place]) Interventions SDOH Screenings   Food Insecurity: No Food Insecurity (12/20/2022)  Housing: Low Risk  (12/20/2022)  Transportation Needs: No Transportation Needs (12/20/2022)  Utilities: Not At Risk (12/20/2022)  Tobacco Use: High Risk (12/21/2022)     Readmission Risk Interventions     No data to display

## 2022-12-23 LAB — CARDIOLIPIN ANTIBODIES, IGG, IGM, IGA
Anticardiolipin IgA: <9 APL U/mL (ref 0–11)
Anticardiolipin IgG: <9 GPL U/mL (ref 0–14)
Anticardiolipin IgM: 12 MPL U/mL (ref 0–12)

## 2022-12-24 ENCOUNTER — Encounter: Payer: Self-pay | Admitting: *Deleted

## 2022-12-24 ENCOUNTER — Other Ambulatory Visit: Payer: Self-pay | Admitting: Physician Assistant

## 2022-12-24 DIAGNOSIS — I63531 Cerebral infarction due to unspecified occlusion or stenosis of right posterior cerebral artery: Secondary | ICD-10-CM

## 2022-12-24 DIAGNOSIS — I639 Cerebral infarction, unspecified: Secondary | ICD-10-CM

## 2022-12-24 DIAGNOSIS — I4891 Unspecified atrial fibrillation: Secondary | ICD-10-CM

## 2022-12-24 NOTE — Progress Notes (Signed)
Patient enrolled for [employer] to ship a 30 day cardiac event monitor to her address on file. Letter with instructions mailed to patient. Dr. Sallyanne Kuster to read and follow up.

## 2022-12-29 DIAGNOSIS — I639 Cerebral infarction, unspecified: Secondary | ICD-10-CM | POA: Diagnosis not present

## 2022-12-29 DIAGNOSIS — I4891 Unspecified atrial fibrillation: Secondary | ICD-10-CM | POA: Diagnosis not present

## 2022-12-29 DIAGNOSIS — I63531 Cerebral infarction due to unspecified occlusion or stenosis of right posterior cerebral artery: Secondary | ICD-10-CM | POA: Diagnosis not present

## 2022-12-31 ENCOUNTER — Encounter: Payer: Self-pay | Admitting: Physical Therapy

## 2022-12-31 ENCOUNTER — Encounter: Payer: Self-pay | Admitting: Occupational Therapy

## 2022-12-31 ENCOUNTER — Ambulatory Visit: Payer: 59 | Attending: Internal Medicine | Admitting: Physical Therapy

## 2022-12-31 ENCOUNTER — Other Ambulatory Visit: Payer: Self-pay

## 2022-12-31 ENCOUNTER — Ambulatory Visit: Payer: 59 | Admitting: Occupational Therapy

## 2022-12-31 VITALS — BP 162/119 | HR 79

## 2022-12-31 DIAGNOSIS — R2681 Unsteadiness on feet: Secondary | ICD-10-CM | POA: Insufficient documentation

## 2022-12-31 DIAGNOSIS — R208 Other disturbances of skin sensation: Secondary | ICD-10-CM

## 2022-12-31 DIAGNOSIS — R29818 Other symptoms and signs involving the nervous system: Secondary | ICD-10-CM

## 2022-12-31 DIAGNOSIS — R29898 Other symptoms and signs involving the musculoskeletal system: Secondary | ICD-10-CM

## 2022-12-31 DIAGNOSIS — R278 Other lack of coordination: Secondary | ICD-10-CM

## 2022-12-31 DIAGNOSIS — M6281 Muscle weakness (generalized): Secondary | ICD-10-CM

## 2022-12-31 NOTE — Therapy (Signed)
OUTPATIENT OCCUPATIONAL THERAPY NEURO EVALUATION  Patient Name: Ashlee Carlson MRN: [id number] DOB:06/03/1961, 62 y.o., female Today's Date: 12/31/2022  PCP: Does not have one REFERRING PROVIDER: Flora Lipps, MD  END OF SESSION:  OT End of Session - 12/31/22 1114     Visit Number 1    Number of Visits 21    Date for OT Re-Evaluation 03/15/23    Authorization Type [employer], [employer] - [place], no auth required    OT Start Time 1112    OT Stop Time 1146    OT Time Calculation (min) 34 min    Activity Tolerance Patient tolerated treatment well    Behavior During Therapy WFL for tasks assessed/performed             Past Medical History:  Diagnosis Date   Hypertension    Shingles    Past Surgical History:  Procedure Laterality Date   BREAST REDUCTION SURGERY     BUBBLE STUDY  12/21/2022   Procedure: BUBBLE STUDY;  Surgeon: Sanda Klein, MD;  Location: [place];  Service: Cardiovascular;;   TEE WITHOUT CARDIOVERSION N/A 12/21/2022   Procedure: TRANSESOPHAGEAL ECHOCARDIOGRAM (TEE);  Surgeon: Sanda Klein, MD;  Location: [place];  Service: Cardiovascular;  Laterality: N/A;   Patient Active Problem List   Diagnosis Date Noted   Cryptogenic stroke 12/21/2022   CVA (cerebral vascular accident) 12/20/2022   Essential hypertension 12/20/2022   Hypertensive urgency 12/20/2022   Bone cyst 12/20/2022   Hypertriglyceridemia 12/20/2022   Cocaine abuse 12/20/2022    ONSET DATE: 12/17/2022  REFERRING DIAG: I63.9 (ICD-10-CM) - Cerebral infarction, unspecified  THERAPY DIAG:  Muscle weakness (generalized)  Other lack of coordination  Other disturbances of skin sensation  Other symptoms and signs involving the musculoskeletal system  Other symptoms and signs involving the nervous system  Rationale for Evaluation and Treatment: Rehabilitation  SUBJECTIVE:   SUBJECTIVE STATEMENT: She is having difficulty with her right hand and needs to get back to  work. 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  Pt accompanied by: self  PERTINENT HISTORY: ***  PRECAUTIONS: Fall  WEIGHT BEARING RESTRICTIONS: No  PAIN:  Are you having pain? Yes: NPRS scale: 4/10 Pain location: L hand (palm) Pain description: throbbing Aggravating factors: unable to identify Relieving factors: unable to identify  FALLS: Has patient fallen in last 6 months? Yes. Number of falls Pt fell while trying to get ready for work walking from bedroom to kitchen, lost her balance  LIVING ENVIRONMENT: Family/patient expects to be discharged to:: Private residence Living Arrangements: Alone Available Help at Discharge: Family Type of Home: House Home Access: Stairs to enter [employer] of Steps: 6 front, 3 back Entrance Stairs-Rails: Right (in front only) Home Layout: One level Bathroom Shower/Tub: Chiropodist: Standard Home Equipment: Cane - single point Additional Comments: daughter available as needed, mom reports pt can stay with them as needed as well  PLOF: Independent/Modified Independent;Driving;Working/employed ADLs Comments: 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  PATIENT GOALS: get back to work and using Left hand better  OBJECTIVE:   HAND DOMINANCE: Right  ADLs: Overall ADLs: mod I with increased time, use of RUE, or modifications  IADLs: Meal Prep: warming up food daughter prepared for her Community mobility: driving  MOBILITY STATUS: Needs Assist: SBA with use of cane  ACTIVITY TOLERANCE: Activity tolerance: ***  FUNCTIONAL OUTCOME MEASURES: FOTO: ***  UPPER EXTREMITY ROM:  AROM Right (eval) Left (eval)  Shoulder flexion WNL WFL with bradykinesia  Shoulder abduction WNL   Elbow flexion WNL   Elbow extension WNL   Wrist flexion WNL   Wrist extension WNL   Wrist pronation WNL   Wrist supination WNL    Digit Composite Flexion WNL 90%  Digit Composite  Extension WNL   Digit Opposition WNL   (Blank rows = not tested)  UPPER EXTREMITY MMT:     MMT Right (eval) Left (eval)  Shoulder flexion WNL   Shoulder abduction WNL   Elbow flexion WNL   Elbow extension WNL   (Blank rows = not tested)  HAND FUNCTION: Grip strength: Right: 49.3 lbs; Left: 10.1 lbs  COORDINATION: 9 Hole Peg test: Right: 30 sec; Left: 52 sec  SENSATION: Reports paresthesias in L hand to elbow  EDEMA: mild  MUSCLE TONE: LUE: Within functional limits  COGNITION: Overall cognitive status: {cognition:24006}  VISION: Subjective report: reports increased blurriness since stroke and perceptual impairments (someone appearing closer to her than they are) Baseline vision: Wears glasses for reading only Visual history:  none reported  VISION ASSESSMENT: Will need to assess at a later date  PERCEPTION: Impaired: need to test further  PRAXIS: {Praxis:25565}  OBSERVATIONS: ***  TODAY'S TREATMENT:                                                                                                                              DATE: ***   PATIENT EDUCATION: Education details: *** Person educated: {Person educated:25204} Education method: {Education Method:25205} Education comprehension: {Education Comprehension:25206}  HOME EXERCISE PROGRAM: ***   GOALS: Goals reviewed with patient? {yes/no:20286}  SHORT TERM GOALS: Target date: ***  *** Baseline: Goal status: {GOALSTATUS:25110}  2.  *** Baseline:  Goal status: {GOALSTATUS:25110}  3.  *** Baseline:  Goal status: {GOALSTATUS:25110}  4.  *** Baseline:  Goal status: {GOALSTATUS:25110}  5.  *** Baseline:  Goal status: {GOALSTATUS:25110}  6.  *** Baseline:  Goal status: {GOALSTATUS:25110}  LONG TERM GOALS: Target date: ***  *** Baseline:  Goal status: {GOALSTATUS:25110}  2.  *** Baseline:  Goal status: {GOALSTATUS:25110}  3.  *** Baseline:  Goal status: {GOALSTATUS:25110}  4.   *** Baseline:  Goal status: {GOALSTATUS:25110}  5.  *** Baseline:  Goal status: {GOALSTATUS:25110}  6.  *** Baseline:  Goal status: {GOALSTATUS:25110}  ASSESSMENT:  CLINICAL IMPRESSION: Patient is a *** y.o. *** who was seen today for occupational therapy evaluation for ***.   PERFORMANCE DEFICITS: in functional skills including {OT physical skills:25468}, cognitive skills including {OT cognitive skills:25469}, and psychosocial skills including {OT psychosocial skills:25470}.   IMPAIRMENTS: are limiting patient from {OT performance deficits:25471}.   CO-MORBIDITIES: {Comorbidities:25485} that affects occupational performance. Patient will benefit from skilled OT to address above impairments and improve overall function.  MODIFICATION OR ASSISTANCE TO COMPLETE EVALUATION: {OT modification:25474}  OT OCCUPATIONAL PROFILE AND HISTORY: {OT PROFILE AND CF:5604106  CLINICAL DECISION MAKING: {OT CDM:25475}  REHAB POTENTIAL: {  rehabpotential:25112}  EVALUATION COMPLEXITY: {Evaluation complexity:25115}    PLAN:  OT FREQUENCY: {rehab frequency:25116}  OT DURATION: {rehab duration:25117}  PLANNED INTERVENTIONS: {OT Interventions:25467}  RECOMMENDED OTHER SERVICES: ***  CONSULTED AND AGREED WITH PLAN OF CARE: [id number]  PLAN FOR NEXT SESSION: LUE ROM HEP   Dennis Bast, OT 12/31/2022, 5:24 PM

## 2022-12-31 NOTE — Therapy (Signed)
  OUTPATIENT PHYSICAL THERAPY NEURO EVALUATION - ARRIVED NO CHARGE   Patient Name: Ashlee Carlson MRN: [id number] DOB:[birth year], 62 y.o., female Today's Date: 12/31/2022   PCP: None REFERRING PROVIDER: Flora Lipps, MD  END OF SESSION:  PT End of Session - 12/31/22 1152     Visit Number 1    Authorization Type [employer]- Employee    PT Start Time 1147    PT Stop Time 1200    PT Time Calculation (min) 13 min    Equipment Utilized During Treatment Gait belt    Activity Tolerance Patient tolerated treatment well    Behavior During Therapy WFL for tasks assessed/performed             Past Medical History:  Diagnosis Date   Hypertension    Shingles    Past Surgical History:  Procedure Laterality Date   BREAST REDUCTION SURGERY     BUBBLE STUDY  12/21/2022   Procedure: BUBBLE STUDY;  Surgeon: Sanda Klein, MD;  Location: [place];  Service: Cardiovascular;;   TEE WITHOUT CARDIOVERSION N/A 12/21/2022   Procedure: TRANSESOPHAGEAL ECHOCARDIOGRAM (TEE);  Surgeon: Sanda Klein, MD;  Location: [facility] END[facility];  Service: Cardiovascular;  Laterality: N/A;   Patient Active Problem List   Diagnosis Date Noted   Cryptogenic stroke 12/21/2022   CVA (cerebral vascular accident) 12/20/2022   Essential hypertension 12/20/2022   Hypertensive urgency 12/20/2022   Bone cyst 12/20/2022   Hypertriglyceridemia 12/20/2022   Cocaine abuse 12/20/2022   Upon onset of session pt endorses she is woozy and feels unsteady on her feet since her stroke.  She is worried about her return to work on 01/10/2023 and is needing to speak to an MD about extending this due to her current state.  PT assessed BP as per evaluation initiation and pt reporting a headache, see values below on LUE in sitting first w/ normal size cuff followed by large cuff with better fit for patient.  Following BP assessment pt voices her BP is always close to [id number] systolically and that she is already on 2 BP meds.  She  is not being medically followed by a PCP so information with phone number for physician referral line provided.  Pt has no access to a computer and is unable to navigate internet access on her phone at home.  Pt did not want to go to ED to be assessed so PT to message internal medicine MD listed in chart for follow-up (pt thinks this is MD who sent this to her home) as well as nurse line provided for further education and referral to appropriate services if patient desires.  Pt has a heart monitor adhered, but is unable to activate on connecting phone so PT will follow-up with Dr. Louanne Belton regarding this and provided number to patient to follow-up as able.  She does not see cardiology until 02/01/2023.  Stroke support group information provided for pt to pursue if interested.  Will reschedule PT evaluation as pt is able to follow-up with PCP or alternative MD for improved BP management.  Today's Vitals   12/31/22 1158 12/31/22 1201  BP: (!) 191/129 (!) 162/119  Pulse: 86 79   There is no height or weight on file to calculate BMI.  Bary Richard, PT, DPT 12/31/2022, 1:00 PM

## 2023-01-01 ENCOUNTER — Telehealth: Payer: Self-pay | Admitting: Emergency Medicine

## 2023-01-01 NOTE — Telephone Encounter (Signed)
Called patient to see if she would be able to come in for an earlier appt, maybe 01/04/23 on DOD day? (This is in regards to the information/messages below) No answer, left message and call back number  (Also forwarded this info to Markus Daft- asked her to call pt to help set up the monitor) Croitoru, Dani Gobble, MD  Bary Richard, PT; Flora Lipps, MD; Minus Breeding, MD; Sharee Holster, RN I only met her to perform TEE. Surprised to see she has follow up with me, but will be glad to see her then. I believe the monitor was going to Dr. Percival Spanish. I have some openings on 01/04/2023 if she can come to see me them.       Previous Messages    ----- Message ----- From: Bary Richard, PT Sent: 01/01/2023   9:36 AM EDT To: Sanda Klein, MD; Flora Lipps, MD Subject: Heart Monitor and BP issues                    Hi Dr. Louanne Belton and Dr. Sallyanne Kuster,  Ashlee Carlson was seen yesterday for PT evaluation, but this was held due to elevated diastolic BP ([id number]) which patient is endorsing is typical for her despite being on anti-hypertensive medications.  She was provided PCP information to establish ongoing care, but as she said she was seen by Dr. Louanne Belton prior and has a scheduled appt in May with Dr. Sallyanne Kuster I wanted to let you both know about this issue.  She also was wearing a heart monitor and unsure what doctor had this mailed to her.  She was unable to navigate setup for this and the phone used for this device was not charged for our clinic to try to assist her yesterday.  I just wanted to reach out in case anyone could have a nurse connect with this patient or someone else who could walk her through this.  Thank you, Elease Etienne, PT, DPT              No answer, left message and call back number

## 2023-01-01 NOTE — Telephone Encounter (Signed)
No answer, no DPR 

## 2023-01-03 LAB — FACTOR 5 LEIDEN

## 2023-01-07 ENCOUNTER — Ambulatory Visit: Payer: 59 | Admitting: Occupational Therapy

## 2023-01-07 LAB — PROTHROMBIN GENE MUTATION

## 2023-01-10 ENCOUNTER — Telehealth: Payer: Self-pay | Admitting: *Deleted

## 2023-01-10 NOTE — Telephone Encounter (Signed)
-----   Message from Rollene Rotunda, MD sent at 01/06/2023  2:01 PM EDT ----- Regarding: FW: Heart Monitor and BP issues I a happy to see her back instead of her Seeing Dr. Royann Shivers.  Can we call her about her monitor and see how we can help her with this.  Thanks.  ----- Message ----- From: Thurmon Fair, MD Sent: 01/01/2023   9:50 AM EDT To: Rollene Rotunda, MD; Joycelyn Das, MD; # Subject: RE: Heart Monitor and BP issues                I only met her to perform TEE. Surprised to see she has follow up with me, but will be glad to see her then. I believe the monitor was going to Dr. Antoine Poche. I have some openings on 01/04/2023 if she can come to see me them.  ----- Message ----- From: Sadie Haber, PT Sent: 01/01/2023   9:36 AM EDT To: Thurmon Fair, MD; Joycelyn Das, MD Subject: Heart Monitor and BP issues                    Hi Dr. Tyson Babinski and Dr. Royann Shivers,  Suki Marcellus was seen yesterday for PT evaluation, but this was held due to elevated diastolic BP (120s) which patient is endorsing is typical for her despite being on anti-hypertensive medications.  She was provided PCP information to establish ongoing care, but as she said she was seen by Dr. Tyson Babinski prior and has a scheduled appt in May with Dr. Royann Shivers I wanted to let you both know about this issue.  She also was wearing a heart monitor and unsure what doctor had this mailed to her.  She was unable to navigate setup for this and the phone used for this device was not charged for our clinic to try to assist her yesterday.  I just wanted to reach out in case anyone could have a nurse connect with this patient or someone else who could walk her through this.  Thank you, Camille Bal, PT, DPT

## 2023-01-10 NOTE — Therapy (Signed)
OUTPATIENT OCCUPATIONAL THERAPY TREATMENT NOTE  Patient Name: Ashlee Carlson MRN: [id number] DOB:Feb 21, 1961, 62 y.o., female Today's Date: 01/14/2023  PCP: Does not have one REFERRING PROVIDER: Joycelyn Das, MD  END OF SESSION:  OT End of Session - 01/14/23 1315     Visit Number 2    Number of Visits 21    Date for OT Re-Evaluation 03/15/23    Authorization Type [employer], [employer] - [place], no auth required    OT Start Time 1315    OT Stop Time 1401    OT Time Calculation (min) 46 min    Activity Tolerance Patient tolerated treatment well;No increased pain;Patient limited by fatigue;Patient limited by lethargy;Patient limited by pain    Behavior During Therapy [facility] for tasks assessed/performed             Past Medical History:  Diagnosis Date   Hypertension    Shingles    Past Surgical History:  Procedure Laterality Date   BREAST REDUCTION SURGERY     BUBBLE STUDY  12/21/2022   Procedure: BUBBLE STUDY;  Surgeon: Thurmon Fair, MD;  Location: [facility] ENDOSCOPY;  Service: Cardiovascular;;   TEE WITHOUT CARDIOVERSION N/A 12/21/2022   Procedure: TRANSESOPHAGEAL ECHOCARDIOGRAM (TEE);  Surgeon: Thurmon Fair, MD;  Location: [facility] ENDOSCOPY;  Service: Cardiovascular;  Laterality: N/A;   Patient Active Problem List   Diagnosis Date Noted   Cryptogenic stroke 12/21/2022   CVA (cerebral vascular accident) 12/20/2022   Essential hypertension 12/20/2022   Hypertensive urgency 12/20/2022   Bone cyst 12/20/2022   Hypertriglyceridemia 12/20/2022   Cocaine abuse 12/20/2022    ONSET DATE: 12/17/2022  REFERRING DIAG: I63.9 (ICD-10-CM) - Cerebral infarction, unspecified  THERAPY DIAG:  Muscle weakness (generalized)  Other lack of coordination  Other disturbances of skin sensation  Rationale for Evaluation and Treatment: Rehabilitation  PERTINENT HISTORY: "Ashlee Carlson is a 62 y.o. female with past medical history of hypertension, shingles presented to hospital with  left-sided numbness and weakness that started 3 days prior to presentation which was associated with progressive difficulty walking.  In the ED patient had elevated blood pressure.  CT of the head was done which showed  age-indeterminate ischemic changes in the brain mostly small vessel type including extensive involvement of the deep gray matter.  Neurology was consulted.  Patient was not a candidate for tPA because of the timeline.  Labs were within normal range.  Subsequently MRI of the brain was done which showed  scattered diffusion restriction and cytotoxic edema in several areas of the right PCA territory with possible involvement of the right hippocampus and advanced chronic small vessel disease bilaterally.  Patient was then started on aspirin and Plavix per neurology recommendations and was admitted hospital for stroke workup."  She is having difficulty with her right hand and needs to get back to work. 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   PRECAUTIONS: Fall; WEIGHT BEARING RESTRICTIONS: No   SUBJECTIVE:   SUBJECTIVE STATEMENT: She states her left hand is numb and she has decreased attention to her Lt hand. She admits to not being compliant with heart monitor bc it's uncomfortable when she is sleeping. She states wanting to see a Child psychotherapist about her short term disability forms and her doctor. She is given a number for social work and her next Doc appt next Wed. She states "I just wan tto sleep all day." She does work light duty  on the night shift currently.    PAIN:  Are you having pain?  Yes: NPRS scale: 4/10 Pain location: L hand (palm) Pain description: throbbing/numbness Aggravating factors: unable to identify Relieving factors: unable to identify  FALLS: Has patient fallen in last 6 months? Yes. 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  PATIENT GOALS: get back to work and using Left hand better   OBJECTIVE: (All objective assessments below are from initial evaluation on: 12/31/22 unless otherwise specified.)   HAND DOMINANCE: Right  ADLs: Overall ADLs: mod I with increased time, use of RUE, or modifications  IADLs: Meal Prep: warming up food daughter prepared for her Community mobility: driving  MOBILITY STATUS: Needs Assist: SBA with use of cane  ACTIVITY TOLERANCE: Activity tolerance: fair  FUNCTIONAL OUTCOME MEASURES: FOTO: Very Severe: 38   UPPER EXTREMITY ROM:     AROM Right (eval) Left (eval)  Shoulder flexion WNL WFL with bradykinesia  Shoulder abduction WNL WFL   Elbow flexion WNL WFL   Elbow extension WNL WFL   Wrist flexion WNL WFL   Wrist extension WNL WFL   Wrist pronation WNL WFL   Wrist supination WNL WFL    Digit Composite Flexion WNL 90%  Digit Composite Extension WNL WFL   Digit Opposition WNL WFL   (Blank rows = not tested)  UPPER EXTREMITY MMT:     MMT Right (eval) Left (eval)  Shoulder flexion WNL 3/5  Shoulder abduction WNL 3/5  Elbow flexion WNL 3/5  Elbow extension WNL 3/5  (Blank rows = not tested)  HAND FUNCTION: Grip strength: Right: 49.3 lbs; Left: 10.1 lbs  COORDINATION: 9 Hole Peg test: Right: 30 sec; Left: 52 sec  SENSATION: 01/14/23: She tests as borderline loss of protective sensation with monofilaments today (red) in entire Lt hand)   Eval:  Reports paresthesias in L hand to elbow  EDEMA: mild  MUSCLE TONE:  01/14/23: noted to have decreased tone, coordination in Lt hand today.    OBSERVATIONS:   01/14/23: no cane needed today. She does present typical of stroke in left hand-decreased sensation decreased coordination decreased tone and slow motion.  Eval: Pt ambulates with use of cane and SBA. No LOB. Appears well kept. General bradykinesia with LUE movements. Has reading glasses with her.   TODAY'S TREATMENT:                                                                                                                               01/14/23: She was supplied with a Child psychotherapistsocial worker  number and advised to call her disability management to ask about her claim with any questions.  Due to her reports of removing her heart monitor at times, she was advised strongly to speak to her cardiologist about likely bogus testing and results, remain vigilant as this is important to her life.  She jokes that "sleeping is more important than her life."  For apparent tightness and stiffness and decreased coordination she is given the following home exercise program that stretches her wrist, has her practiced coordination of the fingers and hand.  She demonstrates each back needing some extra cues and time to practice and especially due to slow motion.  She states understanding needing to do these things about 3-4 times a day every day if she wants to get better.   Exercises - Wrist Prayer Stretch  - 4 x daily - 3 reps - 15 sec hold - Finger Spreading  - 4 x daily - 10 reps - Thumb Opposition  - 4 x daily - 10 reps - Tendon Glides  - 4 x daily - 5 reps - 2-3 seconds hold   PATIENT EDUCATION: Education details: OT role and POC Person educated: Patient Education method: Explanation Education comprehension: verbalized understanding  HOME EXERCISE PROGRAM: Access Code: [email or web address] URL: [email or web address] Date: 01/14/2023 Prepared by: Fannie Knee    GOALS:  SHORT TERM GOALS: Target date: 01/30/2023    Patient will demonstrate independence with initial LUE  HEP. Baseline: Goal status: INITIAL  2.  Pt will demonstrate ability to pick up coin from table using LUE.  Baseline: Unable Goal status: INITIAL  3.  Pt will demonstrate ability to wash face with use of LUE and increased time or modifications as needed.  Baseline: unable to do Goal status: INITIAL  LONG TERM GOALS: Target date: 03/15/2023  Patient will demonstrate updated LUE HEP with 25% verbal cues or less for proper execution. Baseline:  Goal status: INITIAL  2.  Pt will report ability to wipe off counter with no more than some difficulty.  Baseline: Unable Goal status: INITIAL  3.  Patient will demonstrate at least 3 0 lbs L grip strength as needed to open jars and other containers. Baseline: 10.1 lbs Goal status: INITIAL  4.  Patient will complete nine-hole peg with use of L in 30 seconds or less. Baseline: 52 seconds Goal status: INITIAL  5.  Pt will complete D/C FOTO. Baseline:  Goal status: INITIAL   ASSESSMENT:  CLINICAL IMPRESSION: 01/14/23: She has not been compliant with her heart monitor, and is very worried about getting back to work, not being reimbursed very much from disability.  OT advises her to focus on her health and take therapy seriously where she may not have a good recovery.  We will continue per plan of care the best we can   PLAN:  OT FREQUENCY: 2x/week  OT DURATION: 10 weeks  PLANNED INTERVENTIONS: self care/ADL training, therapeutic exercise, therapeutic activity, neuromuscular re-education, manual therapy, passive range of motion, functional mobility training, aquatic therapy, splinting, electrical stimulation, ultrasound, paraffin, fluidotherapy, moist heat, contrast bath, patient/family education, coping strategies training, DME and/or AE instructions, Re-evaluation, and Dry needling  CONSULTED AND AGREED WITH PLAN OF CARE: Patient  PLAN FOR NEXT SESSION:  Review her home exercise program and include nerve gliding to help with  paresthesia and coordination.  Also give out her coordination activity or exercises like "pen tricks" to have something to work on at home as well.  She can upgrade to hand and  arm strengthening when tolerated   Fannie Knee, OT 01/14/2023, 4:10 PM

## 2023-01-10 NOTE — Telephone Encounter (Signed)
Left message for pt to call.

## 2023-01-14 ENCOUNTER — Encounter: Payer: Self-pay | Admitting: Rehabilitative and Restorative Service Providers"

## 2023-01-14 ENCOUNTER — Ambulatory Visit: Payer: 59 | Admitting: Rehabilitative and Restorative Service Providers"

## 2023-01-14 DIAGNOSIS — R29898 Other symptoms and signs involving the musculoskeletal system: Secondary | ICD-10-CM | POA: Diagnosis not present

## 2023-01-14 DIAGNOSIS — M6281 Muscle weakness (generalized): Secondary | ICD-10-CM | POA: Diagnosis not present

## 2023-01-14 DIAGNOSIS — R208 Other disturbances of skin sensation: Secondary | ICD-10-CM | POA: Diagnosis not present

## 2023-01-14 DIAGNOSIS — R29818 Other symptoms and signs involving the nervous system: Secondary | ICD-10-CM | POA: Diagnosis not present

## 2023-01-14 DIAGNOSIS — R2681 Unsteadiness on feet: Secondary | ICD-10-CM | POA: Diagnosis not present

## 2023-01-14 DIAGNOSIS — R278 Other lack of coordination: Secondary | ICD-10-CM

## 2023-01-16 ENCOUNTER — Ambulatory Visit: Payer: 59

## 2023-01-21 ENCOUNTER — Ambulatory Visit: Payer: 59 | Admitting: Occupational Therapy

## 2023-01-21 NOTE — Telephone Encounter (Signed)
Spoke with pt, follow up changed to dr hochrein.

## 2023-01-23 ENCOUNTER — Inpatient Hospital Stay: Payer: 59 | Admitting: Adult Health

## 2023-01-23 ENCOUNTER — Ambulatory Visit: Payer: 59 | Admitting: Occupational Therapy

## 2023-01-23 ENCOUNTER — Encounter: Payer: Self-pay | Admitting: Adult Health

## 2023-01-28 ENCOUNTER — Ambulatory Visit: Payer: 59 | Admitting: Occupational Therapy

## 2023-01-28 ENCOUNTER — Telehealth: Payer: Self-pay | Admitting: Occupational Therapy

## 2023-01-28 NOTE — Telephone Encounter (Signed)
Patient Name: Ashlee Carlson MRN: [id number] DOB:1961-09-27, 62 y.o., female Today's Date: 01/28/2023  OTR contacted patient due to not arriving for scheduled OT visit at [facility] this afternoon.  She reports being used to working 3rd shift and sleeping during the day, therefore overslept.  She was reminded of her appt later this week on Wednesday and stated she would be here.    Victorino Sparrow, OT 01/28/2023, 2:25 PM

## 2023-01-30 ENCOUNTER — Ambulatory Visit: Payer: 59 | Attending: Internal Medicine | Admitting: Occupational Therapy

## 2023-02-01 ENCOUNTER — Ambulatory Visit: Payer: 59 | Admitting: Cardiovascular Disease

## 2023-02-04 ENCOUNTER — Encounter: Payer: Self-pay | Admitting: Occupational Therapy

## 2023-02-04 ENCOUNTER — Ambulatory Visit: Payer: 59 | Admitting: Occupational Therapy

## 2023-02-04 DIAGNOSIS — M6281 Muscle weakness (generalized): Secondary | ICD-10-CM

## 2023-02-04 NOTE — Therapy (Signed)
OCCUPATIONAL THERAPY DISCHARGE SUMMARY  Visits from Start of Care: 2   Pt has been no show for 6 visits  OT is discharging at this time per attendance policy. Pt was called, LVM to make her aware of discharge and need for new referral should she need further services.

## 2023-02-06 ENCOUNTER — Telehealth: Payer: Self-pay

## 2023-02-06 ENCOUNTER — Ambulatory Visit: Payer: 59 | Admitting: Occupational Therapy

## 2023-02-06 NOTE — Telephone Encounter (Signed)
Placed patient event monitor results in provider box for review

## 2023-02-11 ENCOUNTER — Ambulatory Visit: Payer: 59 | Admitting: Occupational Therapy

## 2023-02-13 ENCOUNTER — Ambulatory Visit: Payer: 59 | Admitting: Occupational Therapy

## 2023-02-18 ENCOUNTER — Encounter: Payer: 59 | Admitting: Occupational Therapy

## 2023-02-18 NOTE — Progress Notes (Deleted)
Cardiology Office Note   Date:  02/18/2023   ID:  [name], DOB 1961-09-27, MRN [id number]  PCP:  Patient, No Pcp Per  Cardiologist:   None Referring:  ***  No chief complaint on file.     History of Present Illness: Ashlee Carlson is a 62 y.o. female who presents for follow up post CVA.  She was in the hospital for this for CVA.  She had hypertensive urgency.  ***    1. Left ventricular ejection fraction, by estimation, is 60 to 65%. The  left ventricle has normal function. The left ventricle has no regional  wall motion abnormalities. There is moderate concentric left ventricular  hypertrophy.   2. Right ventricular systolic function is normal. The right ventricular  size is normal.   3. No left atrial/left atrial appendage thrombus was detected.   4. The mitral valve is normal in structure. No evidence of mitral valve  regurgitation. No evidence of mitral stenosis.   5. The aortic valve is tricuspid. Aortic valve regurgitation is not  visualized. No aortic stenosis is present.   6. The inferior vena cava is normal in size with greater than 50%  respiratory variability, suggesting right atrial pressure of 3 mmHg.   Acute CVA CT with concern for stroke and MRI with scattered diffusion restriction in the right PCA territory.  Not a candidate for tPA due to being out of the therapeutic window.  On aspirin, Plavix and Crestor.  Risk factors include recent cocaine use and uncontrolled hypertension.  CT angiogram of the head and neck positive for right PCA occlusion at P1 P2 segment no other large vessel occlusion. Hemoglobin A1c was 6.1.  Lipid panel noted with triglyceride of 175, HDL 65, LDL 69.  Urine drug screen was positive for cocaine.  Hypercoagulable workup has been sent.  EEG was negative.  Patient will need 30-day cardiac event monitoring as outpatient .   PT recommends outpatient PT.  Neurology recommends aspirin Plavix for 3 months then aspirin alone.   Communicated with cardiology for 30-day monitoring as outpatient.    Hypertensive urgency Could be secondary to cocaine usage.  Elevated on presentation.  Amlodipine dose was increased to 10 mg and patient has been started on losartan 50mg  daily.  Will need further adjustment of blood pressure as outpatient.  Patient was emphasized the need for adequate blood pressure monitoring and adjustment with her medications to decrease risk of further stroke.   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 We need to follow-up with oral maxillofacial surgery as outpatient.  Patient denies any symptoms.   Hypertriglyceridemia Been started on statins.  Will continue on discharge.   Cocaine abuse Urine drug screen was positive for cocaine.  Counseling done.  Patient states that somebody had left something like a salt which she took mistakenly.   Disposition.  At this time, patient is stable for disposition home with outpatient PCP and neurology follow-up    Past Medical History:  Diagnosis Date   Hypertension    Shingles     Past Surgical History:  Procedure Laterality Date   BREAST REDUCTION SURGERY     BUBBLE STUDY  12/21/2022   Procedure: BUBBLE STUDY;  Surgeon: Thurmon Fair, MD;  Location: [facility] ENDOSCOPY;  Service: Cardiovascular;;   TEE WITHOUT CARDIOVERSION N/A 12/21/2022   Procedure: TRANSESOPHAGEAL ECHOCARDIOGRAM (TEE);  Surgeon: Thurmon Fair, MD;  Location: [facility] ENDOSCOPY;  Service: Cardiovascular;  Laterality: N/A;     Current Outpatient Medications  Medication  Sig Dispense Refill   acetaminophen (TYLENOL) 500 MG tablet Take 1 tablet (500 mg total) by mouth every 6 (six) hours as needed. (Patient taking differently: Take 500 mg by mouth every 8 (eight) hours as needed for moderate pain.) 30 tablet 0   amLODipine (NORVASC) 10 MG tablet Take 1 tablet (10 mg total) by mouth daily. 90 tablet 0   aspirin EC 81 MG tablet Take 1 tablet (81 mg total) by mouth daily. Swallow whole. 30 tablet 12    clopidogrel (PLAVIX) 75 MG tablet Take 1 tablet (75 mg total) by mouth daily. 90 tablet 0   hydrocortisone cream 1 % Apply to affected area 2 times daily (Patient not taking: Reported on 07/22/2015) 15 g 0   losartan (COZAAR) 50 MG tablet Take 1 tablet (50 mg total) by mouth daily. 30 tablet 2   rosuvastatin (CRESTOR) 10 MG tablet Take 1 tablet (10 mg total) by mouth daily. 30 tablet 2   No current facility-administered medications for this visit.    Allergies:   Patient has no known allergies.    Social History:  The patient  reports that she quit smoking about 2 months ago. Her smoking use included cigarettes. She smoked an average of .5 packs per day. She does not have any smokeless tobacco history on file. She reports current alcohol use. She reports that she does not use drugs.   Family History:  The patient's ***family history is not on file.    ROS:  Please see the history of present illness.   Otherwise, review of systems are positive for {NONE DEFAULTED:18576}.   All other systems are reviewed and negative.    PHYSICAL EXAM: VS:  There were no vitals taken for this visit. , BMI There is no height or weight on file to calculate BMI. GENERAL:  Well appearing HEENT:  Pupils equal round and reactive, fundi not visualized, oral mucosa unremarkable NECK:  No jugular venous distention, waveform within normal limits, carotid upstroke brisk and symmetric, no bruits, no thyromegaly LYMPHATICS:  No cervical, inguinal adenopathy LUNGS:  Clear to auscultation bilaterally BACK:  No CVA tenderness CHEST:  Unremarkable HEART:  PMI not displaced or sustained,S1 and S2 within normal limits, no S3, no S4, no clicks, no rubs, *** murmurs ABD:  Flat, positive bowel sounds normal in frequency in pitch, no bruits, no rebound, no guarding, no midline pulsatile mass, no hepatomegaly, no splenomegaly EXT:  2 plus pulses throughout, no edema, no cyanosis no clubbing SKIN:  No rashes no nodules NEURO:   Cranial nerves II through XII grossly intact, motor grossly intact throughout PSYCH:  Cognitively intact, oriented to person place and time    EKG:  EKG {ACTION; IS/IS UEA:[id number]} ordered today. The ekg ordered today demonstrates ***   Recent Labs: 12/20/2022: ALT 15; BUN 21; Creatinine, Ser 1.10; Hemoglobin 13.6; Platelets 300; Potassium 3.2; Sodium 141; TSH 0.504    Lipid Panel    Component Value Date/Time   CHOL 170 12/20/2022 0825   TRIG 179 (H) 12/20/2022 0825   HDL 65 12/20/2022 0825   CHOLHDL 2.6 12/20/2022 0825   VLDL 36 12/20/2022 0825   LDLCALC 69 12/20/2022 0825      Wt Readings from Last 3 Encounters:  12/21/22 219 lb (99.3 kg)  03/06/22 212 lb (96.2 kg)  07/28/17 183 lb (83 kg)      Other studies Reviewed: Additional studies/ records that were reviewed today include: ***. Review of the above records demonstrates:  Please see elsewhere  in the note.  ***   ASSESSMENT AND PLAN:  CVA:  ***  HTN:  ***   Current medicines are reviewed at length with the patient today.  The patient {ACTIONS; HAS/DOES NOT HAVE:19233} concerns regarding medicines.  The following changes have been made:  {PLAN; NO CHANGE:13088:s}  Labs/ tests ordered today include: *** No orders of the defined types were placed in this encounter.    Disposition:   FU with ***    Signed, Rollene Rotunda, MD  02/18/2023 9:41 PM    [facility]

## 2023-02-20 ENCOUNTER — Encounter: Payer: 59 | Admitting: Occupational Therapy

## 2023-02-21 ENCOUNTER — Ambulatory Visit: Payer: 59 | Admitting: Cardiology

## 2023-02-26 ENCOUNTER — Ambulatory Visit: Payer: 59 | Admitting: Occupational Therapy

## 2023-02-28 ENCOUNTER — Ambulatory Visit: Payer: 59 | Admitting: Occupational Therapy

## 2023-03-04 ENCOUNTER — Ambulatory Visit: Payer: 59 | Admitting: Occupational Therapy

## 2023-03-06 ENCOUNTER — Ambulatory Visit: Payer: 59 | Admitting: Occupational Therapy

## 2023-03-11 ENCOUNTER — Encounter: Payer: 59 | Admitting: Occupational Therapy

## 2023-03-13 ENCOUNTER — Encounter: Payer: 59 | Admitting: Occupational Therapy

## 2023-03-18 ENCOUNTER — Encounter: Payer: 59 | Admitting: Occupational Therapy

## 2023-03-20 ENCOUNTER — Encounter: Payer: 59 | Admitting: Occupational Therapy

## 2023-04-11 ENCOUNTER — Ambulatory Visit: Payer: 59 | Attending: Cardiovascular Disease | Admitting: Internal Medicine

## 2023-04-11 ENCOUNTER — Encounter: Payer: Self-pay | Admitting: Internal Medicine

## 2023-04-23 NOTE — Progress Notes (Unsigned)
[facility] [address] [place]. [place] [address] [phone]       HOSPITAL FOLLOW UP NOTE  [name] Date of Birth:  Oct 10, 1960 Medical Record Number:  [id number]   Reason for Referral:  hospital stroke follow up    SUBJECTIVE:   CHIEF COMPLAINT:  No chief complaint on file.   HPI:   Ashlee Carlson is a 62 y.o. who  has a past medical history of Hypertension and Shingles.  Patient presented on 12/20/2022 with 3 days history of left sided numbness and weakness with progressive difficulty walking. CT showed small vessel disease. MRI showed scattered diffusion restriction and cytotoxic edema in several areas of the right PCA territory with possible involvement of the right hippocampus and advanced chronic small vessel disease bilaterally. UDS positive for cocaine. She was started on asa 81mg  and Plavix for three months then asa alone given right PCA occlusion. Cardiac monitor advised outpatient. Outpatient PT/OT advised. She was discharged home 11/2022. Personally reviewed hospitalization pertinent progress notes, lab work and imaging.  Evaluated by Dr Roda Shutters.   Since discharge,   30 heart monitor showed no significant abnormalities.   She had outpatient PT eval 12/31/2022 but session was cancelled due to HTN and patient reporting feeling woosy. She never returned. OT note 02/04/2023 reports discharge for 6 no shows.  PCP?    PERTINENT IMAGING/LABS  Code Stroke CT head difficult to exclude a small acute or subacute occipital pole Right PCA territory infarct. No acute intracranial hemorrhage or mass effect CTA head & neck: pending Positive for Right PCA occlusion at the P1/P2 segment junction. More pronounced intracranial atherosclerosis affecting the 2nd and 3rd order circle-of-Willis branches.  MRI:  Scattered diffusion restriction and cytotoxic edema in several areas of the Right PCA territory. Underlying advance small vessel disease. TEE  unremarkable VAS [place] LE DVT: Negative Recommend 30 day cardiac event monitoring as outpt to rule out afib   A1C Lab Results  Component Value Date   HGBA1C 6.1 (H) 12/20/2022    Lipid Panel     Component Value Date/Time   CHOL 170 12/20/2022 0825   TRIG 179 (H) 12/20/2022 0825   HDL 65 12/20/2022 0825   CHOLHDL 2.6 12/20/2022 0825   VLDL 36 12/20/2022 0825   LDLCALC 69 12/20/2022 0825      ROS:   14 system review of systems performed and negative with exception of those listed in HPI  PMH:  Past Medical History:  Diagnosis Date   Hypertension    Shingles     PSH:  Past Surgical History:  Procedure Laterality Date   BREAST REDUCTION SURGERY     BUBBLE STUDY  12/21/2022   Procedure: BUBBLE STUDY;  Surgeon: Thurmon Fair, MD;  Location: [facility] ENDOSCOPY;  Service: Cardiovascular;;   TEE WITHOUT CARDIOVERSION N/A 12/21/2022   Procedure: TRANSESOPHAGEAL ECHOCARDIOGRAM (TEE);  Surgeon: Thurmon Fair, MD;  Location: [facility] ENDOSCOPY;  Service: Cardiovascular;  Laterality: N/A;    Social History:  Social History   Socioeconomic History   Marital status: Single    Spouse name: Not on file   Number of children: Not on file   Years of education: Not on file   Highest education level: Not on file  Occupational History   Not on file  Tobacco Use   Smoking status: Former    Current packs/day: 0.00    Types: Cigarettes    Quit date: 12/15/2022    Years since quitting: 0.3   Smokeless tobacco:  Not on file  Substance and Sexual Activity   Alcohol use: Yes    Comment: daily   Drug use: No   Sexual activity: Not on file  Other Topics Concern   Not on file  Social History Narrative   Not on file   Social Determinants of Health   Financial Resource Strain: Not on file  Food Insecurity: No Food Insecurity (12/20/2022)   Hunger Vital Sign    Worried About Running Out of Food in the Last Year: Never true    Ran Out of Food in the Last Year: Never true  Transportation  Needs: No Transportation Needs (12/20/2022)   PRAPARE - Administrator, Civil Service (Medical): No    Lack of Transportation (Non-Medical): No  Physical Activity: Not on file  Stress: Not on file  Social Connections: Not on file  Intimate Partner Violence: Not At Risk (12/20/2022)   Humiliation, Afraid, Rape, and Kick questionnaire    Fear of Current or Ex-Partner: No    Emotionally Abused: No    Physically Abused: No    Sexually Abused: No    Family History: No family history on file.  Medications:   Current Outpatient Medications on File Prior to Visit  Medication Sig Dispense Refill   acetaminophen (TYLENOL) 500 MG tablet Take 1 tablet (500 mg total) by mouth every 6 (six) hours as needed. (Patient taking differently: Take 500 mg by mouth every 8 (eight) hours as needed for moderate pain.) 30 tablet 0   amLODipine (NORVASC) 10 MG tablet Take 1 tablet (10 mg total) by mouth daily. 90 tablet 0   aspirin EC 81 MG tablet Take 1 tablet (81 mg total) by mouth daily. Swallow whole. 30 tablet 12   hydrocortisone cream 1 % Apply to affected area 2 times daily (Patient not taking: Reported on 07/22/2015) 15 g 0   losartan (COZAAR) 50 MG tablet Take 1 tablet (50 mg total) by mouth daily. 30 tablet 2   rosuvastatin (CRESTOR) 10 MG tablet Take 1 tablet (10 mg total) by mouth daily. 30 tablet 2   No current facility-administered medications on file prior to visit.    Allergies:  No Known Allergies    OBJECTIVE:  Physical Exam  There were no vitals filed for this visit. There is no height or weight on file to calculate BMI. No results found.      No data to display           General: well developed, well nourished, seated, in no evident distress Head: head normocephalic and atraumatic.   Neck: supple with no carotid or supraclavicular bruits Cardiovascular: regular rate and rhythm, no murmurs Musculoskeletal: no deformity Skin:  no rash/petichiae Vascular:  Normal  pulses all extremities   Neurologic Exam Mental Status: Awake and fully alert.  Fluent speech and language.  Oriented to place and time. Recent and remote memory intact. Attention span, concentration and fund of knowledge appropriate. Mood and affect appropriate.  Cranial Nerves: Fundoscopic exam reveals sharp disc margins. Pupils equal, briskly reactive to light. Extraocular movements full without nystagmus. Visual fields full to confrontation. Hearing intact. Facial sensation intact. Face, tongue, palate moves normally and symmetrically.  Motor: Normal bulk and tone. Normal strength in all tested extremity muscles Sensory.: intact to touch , pinprick , position and vibratory sensation.  Coordination: Rapid alternating movements normal in all extremities. Finger-to-nose and heel-to-shin performed accurately bilaterally. Gait and Station: Arises from chair without difficulty. Stance is normal. Gait demonstrates  normal stride length and balance with ***. Tandem walk and heel toe ***.  Reflexes: 1+ and symmetric.    NIHSS  *** Modified Rankin  ***    ASSESSMENT: Ashlee Carlson is a 62 y.o. year old female presenting 12/20/2022 with left sided numbness and weakness starting three days prior with progressive difficulty walking. Vascular risk factors include HTN, cocaine use, tobacco use, obesity.      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 Continue aspirin 81 mg daily  and rosuvastatin 10mg  daily for secondary stroke prevention.  Discussed secondary stroke prevention measures and importance of close PCP follow up for aggressive stroke risk factor management. I have gone over the pathophysiology of stroke, warning signs and symptoms, risk factors and their management in some detail with instructions to go to the closest emergency room for symptoms of concern. HTN: BP  goal <130/90.  Stable on Norvasc and losartan. Continue per PCP HLD: LDL goal <70. Recent LDL 69. Continue rosuvastatin 10mg  daily per PCP.   DMII: A1c goal<7.0. Recent A1c 6.1. Prediabetes. Continue well balanced diet low in carbohydrates. Continue to monitor through PCP.  Tobacco use: cessation advised Cocaine use: cessation advised  Obesity: Continue well balanced diet with regular exercise. Monitor regularly through PCP.    Follow up in *** or call earlier if needed   CC:  [facility] provider: Dr. Pearlean Brownie PCP: Patient, No Pcp Per    I spent *** minutes of face-to-face and non-face-to-face time with patient.  This included previsit chart review including review of recent hospitalization, lab review, study review, order entry, electronic health record documentation, patient education regarding recent stroke including etiology, secondary stroke prevention measures and importance of managing stroke risk factors, residual deficits and typical recovery time and answered all other questions to patient satisfaction   Shawnie Dapper, [facility]  [facility] [address] [place], [place] [address]  Phone [phone] Fax [phone] Note: This document was prepared with digital dictation and possible smart phrase technology. Any transcriptional errors that result from this process are unintentional.

## 2023-04-23 NOTE — Patient Instructions (Signed)
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 Continue aspirin 81 mg daily  and rosuvastatin 10mg  daily for secondary stroke prevention.  Discussed secondary stroke prevention measures and importance of close PCP follow up for aggressive stroke risk factor management. I have gone over the pathophysiology of stroke, warning signs and symptoms, risk factors and their management in some detail with instructions to go to the closest emergency room for symptoms of concern. HTN: BP goal <130/90. Elevated. Current taking Norvasc and losartan but has not taken consistently. Advised to take asap upon returning home and monitor when she arrives to work this evening. If remains greater than 180/90, please seek emergency medical attention. Continue per PCP. 60 day refill provided until she can establish care then will continue refills through PCP.  HLD: LDL goal <70. Recent LDL 69. Continue rosuvastatin 10mg  daily per PCP.  60 day refill provided until she can establish care then will continue refills through PCP.  DMII: A1c goal<7.0. Recent A1c 6.1. Prediabetes. Continue well balanced diet low in carbohydrates. Continue to monitor through PCP.  Left sided numbness: PT/OT ordered for eval and treat.  Aphasia/dysarthria: ST ordered for eval and treat Tobacco use: cessation advised Cocaine use: cessation advised Obesity: Continue well balanced diet with regular exercise. Monitor regularly through PCP.  Social determinants of health: social work referral placed Need to establish care with family practice: referral placed for PCP.   Goals:  1) Maintain strict control of hypertension with blood pressure goal below 130/90 2) Maintain good control of diabetes with hemoglobin A1c goal below 7%  3) Maintain good control of lipids with LDL  cholesterol goal below 70 mg/dL.  4) Eat a healthy diet with plenty of whole grains, cereals, fruits and vegetables, exercise regularly and maintain ideal body weight   Resources: https://www.williams.biz/  Please make sure you are staying well hydrated. I recommend 50-60 ounces daily. Well balanced diet and regular exercise encouraged. Consistent sleep schedule with 6-8 hours recommended.   Please continue follow up with care team as directed.   Follow up with me in 3-4 months  You may receive a survey regarding today's visit. I encourage you to leave honest feed back as I do use this information to improve patient care. Thank you for seeing me today!

## 2023-04-24 ENCOUNTER — Other Ambulatory Visit (HOSPITAL_COMMUNITY): Payer: Self-pay

## 2023-04-24 ENCOUNTER — Telehealth: Payer: Self-pay | Admitting: Family Medicine

## 2023-04-24 ENCOUNTER — Ambulatory Visit: Payer: 59 | Admitting: Family Medicine

## 2023-04-24 ENCOUNTER — Encounter: Payer: Self-pay | Admitting: Family Medicine

## 2023-04-24 VITALS — BP 200/99 | HR 71 | Ht 64.0 in | Wt 183.5 lb

## 2023-04-24 DIAGNOSIS — I69320 Aphasia following cerebral infarction: Secondary | ICD-10-CM

## 2023-04-24 DIAGNOSIS — R200 Anesthesia of skin: Secondary | ICD-10-CM | POA: Diagnosis not present

## 2023-04-24 DIAGNOSIS — E782 Mixed hyperlipidemia: Secondary | ICD-10-CM

## 2023-04-24 DIAGNOSIS — I63531 Cerebral infarction due to unspecified occlusion or stenosis of right posterior cerebral artery: Secondary | ICD-10-CM

## 2023-04-24 DIAGNOSIS — I639 Cerebral infarction, unspecified: Secondary | ICD-10-CM | POA: Diagnosis not present

## 2023-04-24 DIAGNOSIS — I10 Essential (primary) hypertension: Secondary | ICD-10-CM | POA: Diagnosis not present

## 2023-04-24 DIAGNOSIS — K117 Disturbances of salivary secretion: Secondary | ICD-10-CM | POA: Diagnosis not present

## 2023-04-24 DIAGNOSIS — R42 Dizziness and giddiness: Secondary | ICD-10-CM | POA: Diagnosis not present

## 2023-04-24 MED ORDER — ROSUVASTATIN CALCIUM 10 MG PO TABS
10.0000 mg | ORAL_TABLET | Freq: Every day | ORAL | 1 refills | Status: AC
  Filled 2023-04-24 – 2023-05-16 (×2): qty 30, 30d supply, fill #0

## 2023-04-24 MED ORDER — AMLODIPINE BESYLATE 10 MG PO TABS
10.0000 mg | ORAL_TABLET | Freq: Every day | ORAL | 1 refills | Status: AC
  Filled 2023-04-24 – 2023-05-16 (×2): qty 90, 90d supply, fill #0

## 2023-04-24 MED ORDER — LOSARTAN POTASSIUM 50 MG PO TABS
50.0000 mg | ORAL_TABLET | Freq: Every day | ORAL | 1 refills | Status: AC
  Filled 2023-04-24 – 2023-05-16 (×2): qty 30, 30d supply, fill #0

## 2023-04-24 NOTE — Telephone Encounter (Signed)
Referral for social work fax to [facility]. Phone: [phone], Fax: [phone]

## 2023-04-24 NOTE — Telephone Encounter (Signed)
Referral for family practice fax to [facility]. Phone: [phone], Fax: [phone]

## 2023-04-24 NOTE — Telephone Encounter (Signed)
They sent a fax back that they are not taking any new patients.

## 2023-04-25 NOTE — Telephone Encounter (Signed)
Sent referral for Family Practice to [facility]. Phone: [phone], Fax:[phone]

## 2023-05-06 ENCOUNTER — Other Ambulatory Visit (HOSPITAL_COMMUNITY): Payer: Self-pay

## 2023-05-16 ENCOUNTER — Other Ambulatory Visit (HOSPITAL_COMMUNITY): Payer: Self-pay

## 2023-05-28 NOTE — Telephone Encounter (Signed)
Received fax from [place] that they tried contacting pt 3x, on the 3rd attempt pt informed them that she is no longer living in [place].

## 2023-06-02 DIAGNOSIS — Z419 Encounter for procedure for purposes other than remedying health state, unspecified: Secondary | ICD-10-CM | POA: Diagnosis not present

## 2023-06-26 NOTE — Progress Notes (Deleted)
Cardiology Office Note   Date:  06/26/2023   ID:  Ashlee Carlson, DOB 1961/05/22, MRN [id number]  PCP:  Patient, No Pcp Per  Cardiologist:   None Referring:  ***  No chief complaint on file.     History of Present Illness: Ashlee Carlson is a 62 y.o. female who presents for ***      Acute CVA CT with concern for stroke and MRI with scattered diffusion restriction in the right PCA territory.  Not a candidate for tPA due to being out of the therapeutic window.  On aspirin, Plavix and Crestor.  Risk factors include recent cocaine use and uncontrolled hypertension.  CT angiogram of the head and neck positive for right PCA occlusion at P1 P2 segment no other large vessel occlusion. Hemoglobin A1c was 6.1.  Lipid panel noted with triglyceride of 175, HDL 65, LDL 69.  Urine drug screen was positive for cocaine.  Hypercoagulable workup has been sent.  EEG was negative.  Patient will need 30-day cardiac event monitoring as outpatient .   PT recommends outpatient PT.  Neurology recommends aspirin Plavix for 3 months then aspirin alone.  Communicated with cardiology for 30-day monitoring as outpatient.    Hypertensive urgency Could be secondary to cocaine usage.  Elevated on presentation.  Amlodipine dose was increased to 10 mg and patient has been started on losartan 50mg  daily.  Will need further adjustment of blood pressure as outpatient.  Patient was emphasized the need for adequate blood pressure monitoring and adjustment with her medications to decrease risk of further stroke.   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 We need to follow-up with oral maxillofacial surgery as outpatient.  Patient denies any symptoms.   Hypertriglyceridemia Been started on statins.  Will continue on discharge.   Cocaine abuse Urine drug screen was positive for cocaine.  Counseling done.  Patient states that somebody had left something like a salt which she took mistakenly.  Past Medical History:   Diagnosis Date   Hypertension    Shingles     Past Surgical History:  Procedure Laterality Date   BREAST REDUCTION SURGERY     BUBBLE STUDY  12/21/2022   Procedure: BUBBLE STUDY;  Surgeon: Thurmon Fair, MD;  Location: [facility] ENDOSCOPY;  Service: Cardiovascular;;   TEE WITHOUT CARDIOVERSION N/A 12/21/2022   Procedure: TRANSESOPHAGEAL ECHOCARDIOGRAM (TEE);  Surgeon: Thurmon Fair, MD;  Location: [facility] ENDOSCOPY;  Service: Cardiovascular;  Laterality: N/A;     Current Outpatient Medications  Medication Sig Dispense Refill   amLODipine (NORVASC) 10 MG tablet Take 1 tablet (10 mg total) by mouth daily. 90 tablet 1   aspirin EC 81 MG tablet Take 81 mg by mouth daily. Swallow whole.     losartan (COZAAR) 50 MG tablet Take 1 tablet (50 mg total) by mouth daily. 30 tablet 1   rosuvastatin (CRESTOR) 10 MG tablet Take 1 tablet (10 mg total) by mouth daily. 30 tablet 1   No current facility-administered medications for this visit.    Allergies:   Patient has no known allergies.    Social History:  The patient  reports that she quit smoking about 6 months ago. Her smoking use included cigarettes. She does not have any smokeless tobacco history on file. She reports current alcohol use. She reports that she does not use drugs.   Family History:  The patient's ***family history is not on file.    ROS:  Please see the history of present illness.   Otherwise,  review of systems are positive for {NONE DEFAULTED:18576}.   All other systems are reviewed and negative.    PHYSICAL EXAM: VS:  There were no vitals taken for this visit. , BMI There is no height or weight on file to calculate BMI. GENERAL:  Well appearing HEENT:  Pupils equal round and reactive, fundi not visualized, oral mucosa unremarkable NECK:  No jugular venous distention, waveform within normal limits, carotid upstroke brisk and symmetric, no bruits, no thyromegaly LYMPHATICS:  No cervical, inguinal adenopathy LUNGS:  Clear to  auscultation bilaterally BACK:  No CVA tenderness CHEST:  Unremarkable HEART:  PMI not displaced or sustained,S1 and S2 within normal limits, no S3, no S4, no clicks, no rubs, *** murmurs ABD:  Flat, positive bowel sounds normal in frequency in pitch, no bruits, no rebound, no guarding, no midline pulsatile mass, no hepatomegaly, no splenomegaly EXT:  2 plus pulses throughout, no edema, no cyanosis no clubbing SKIN:  No rashes no nodules NEURO:  Cranial nerves II through XII grossly intact, motor grossly intact throughout PSYCH:  Cognitively intact, oriented to person place and time   EKG:       Recent Labs: 12/20/2022: ALT 15; BUN 21; Creatinine, Ser 1.10; Hemoglobin 13.6; Platelets 300; Potassium 3.2; Sodium 141; TSH 0.504    Lipid Panel    Component Value Date/Time   CHOL 170 12/20/2022 0825   TRIG 179 (H) 12/20/2022 0825   HDL 65 12/20/2022 0825   CHOLHDL 2.6 12/20/2022 0825   VLDL 36 12/20/2022 0825   LDLCALC 69 12/20/2022 0825      Wt Readings from Last 3 Encounters:  04/24/23 183 lb 8 oz (83.2 kg)  12/21/22 219 lb (99.3 kg)  03/06/22 212 lb (96.2 kg)      Other studies Reviewed: Additional studies/ records that were reviewed today include: ***. Review of the above records demonstrates:  Please see elsewhere in the note.  ***   ASSESSMENT AND PLAN:  CVA: ***  HTN: ***  Hypertriglyceridemia:  ***   Current medicines are reviewed at length with the patient today.  The patient {ACTIONS; HAS/DOES NOT HAVE:19233} concerns regarding medicines.  The following changes have been made:  {PLAN; NO CHANGE:13088:s}  Labs/ tests ordered today include: *** No orders of the defined types were placed in this encounter.    Disposition:   FU with ***    Signed, Rollene Rotunda, MD  06/26/2023 3:23 PM    [facility]

## 2023-06-27 ENCOUNTER — Ambulatory Visit: Payer: Medicaid Other | Attending: Cardiology | Admitting: Cardiology

## 2023-06-27 DIAGNOSIS — I63531 Cerebral infarction due to unspecified occlusion or stenosis of right posterior cerebral artery: Secondary | ICD-10-CM

## 2023-06-27 DIAGNOSIS — I10 Essential (primary) hypertension: Secondary | ICD-10-CM

## 2023-07-02 DIAGNOSIS — Z419 Encounter for procedure for purposes other than remedying health state, unspecified: Secondary | ICD-10-CM | POA: Diagnosis not present

## 2023-08-02 ENCOUNTER — Encounter: Payer: Self-pay | Admitting: *Deleted

## 2023-08-02 DIAGNOSIS — Z419 Encounter for procedure for purposes other than remedying health state, unspecified: Secondary | ICD-10-CM | POA: Diagnosis not present

## 2023-08-15 ENCOUNTER — Telehealth: Payer: Self-pay | Admitting: Cardiology

## 2023-08-15 NOTE — Telephone Encounter (Signed)
Patient dismissed form Dr. Rollene Rotunda medical care due to multiple missed and cancelled appointments. 08/02/23

## 2023-09-01 DIAGNOSIS — Z419 Encounter for procedure for purposes other than remedying health state, unspecified: Secondary | ICD-10-CM | POA: Diagnosis not present

## 2023-09-08 IMAGING — CR DG CHEST 2V
2 series · 2 of 2 positions shown · non-contrast
Comparison: 07/28/2017

CLINICAL DATA: Chest pain.

EXAM:
CHEST - 2 VIEW

[chest lat]
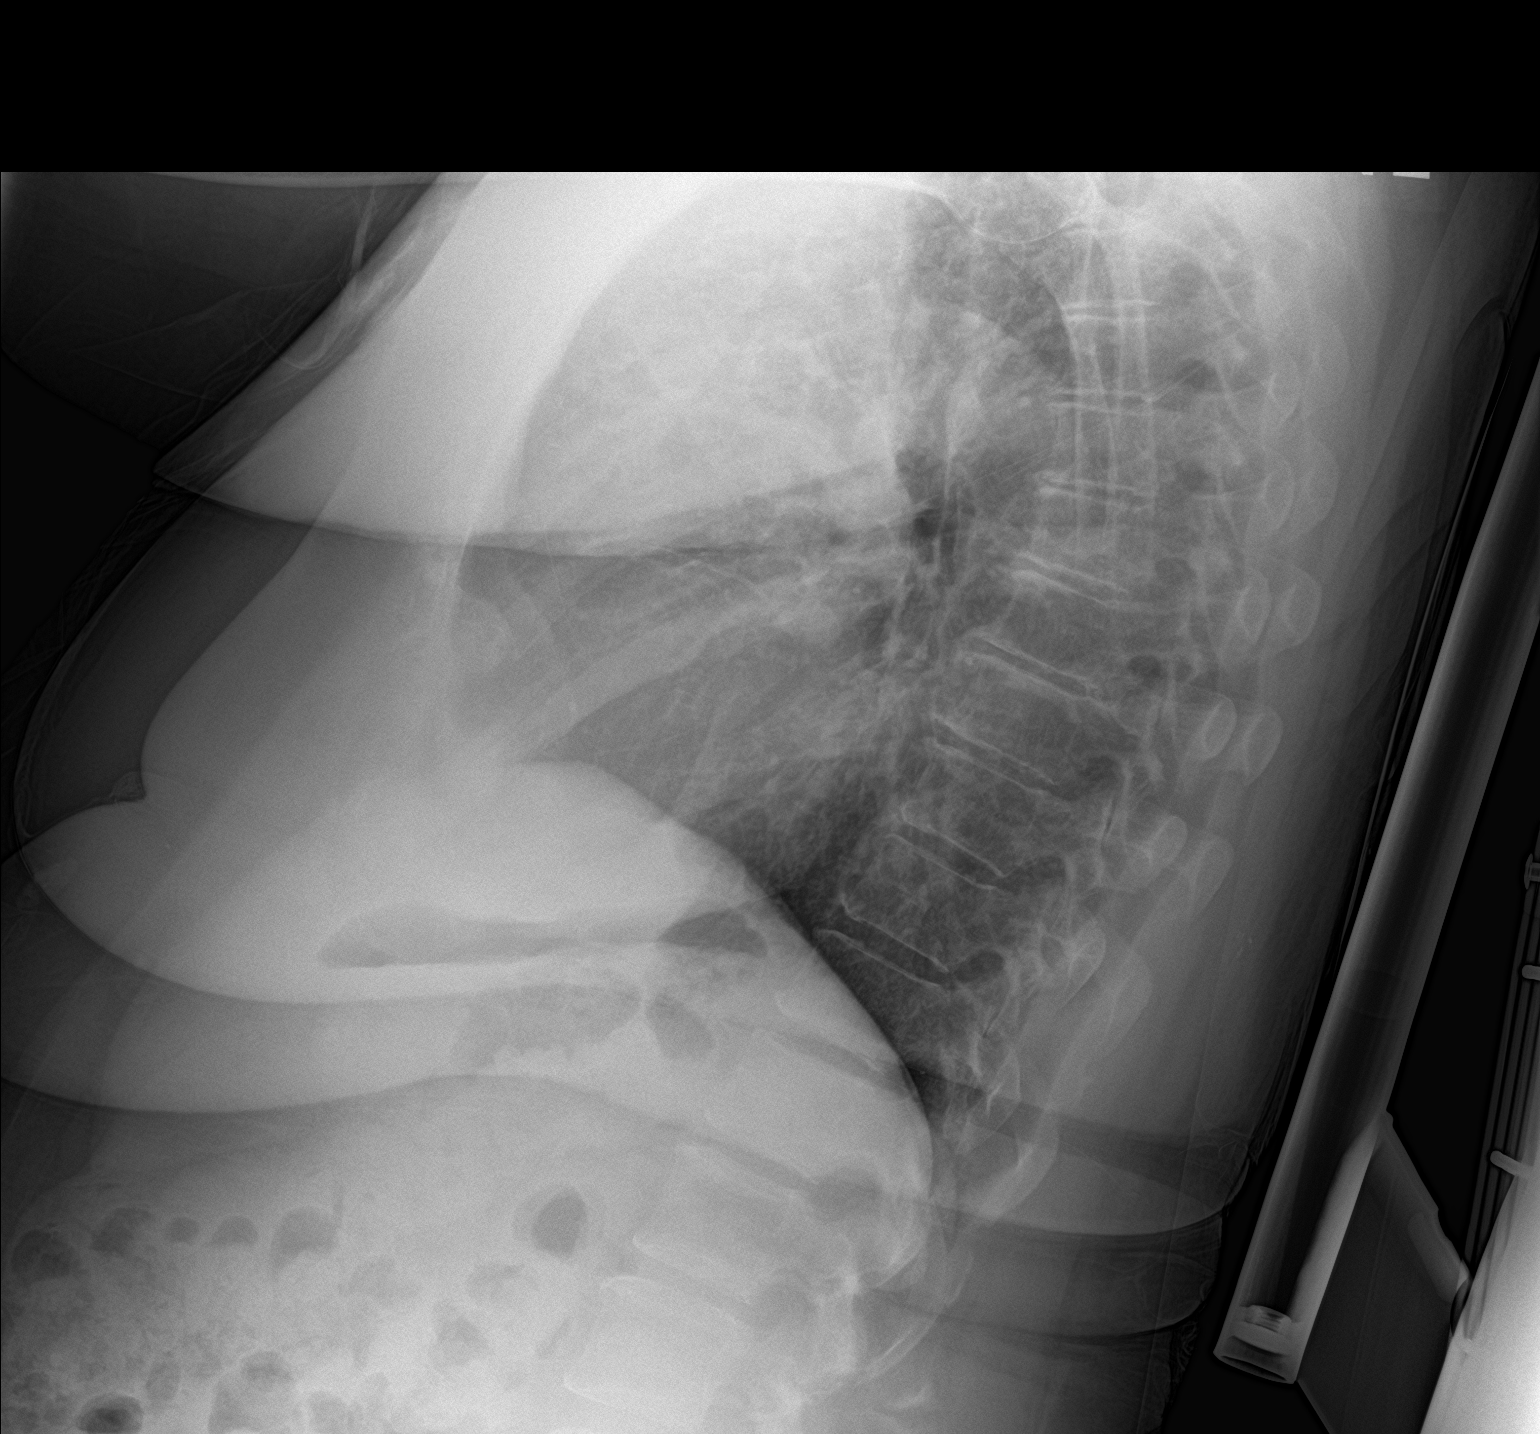

[chest ap]
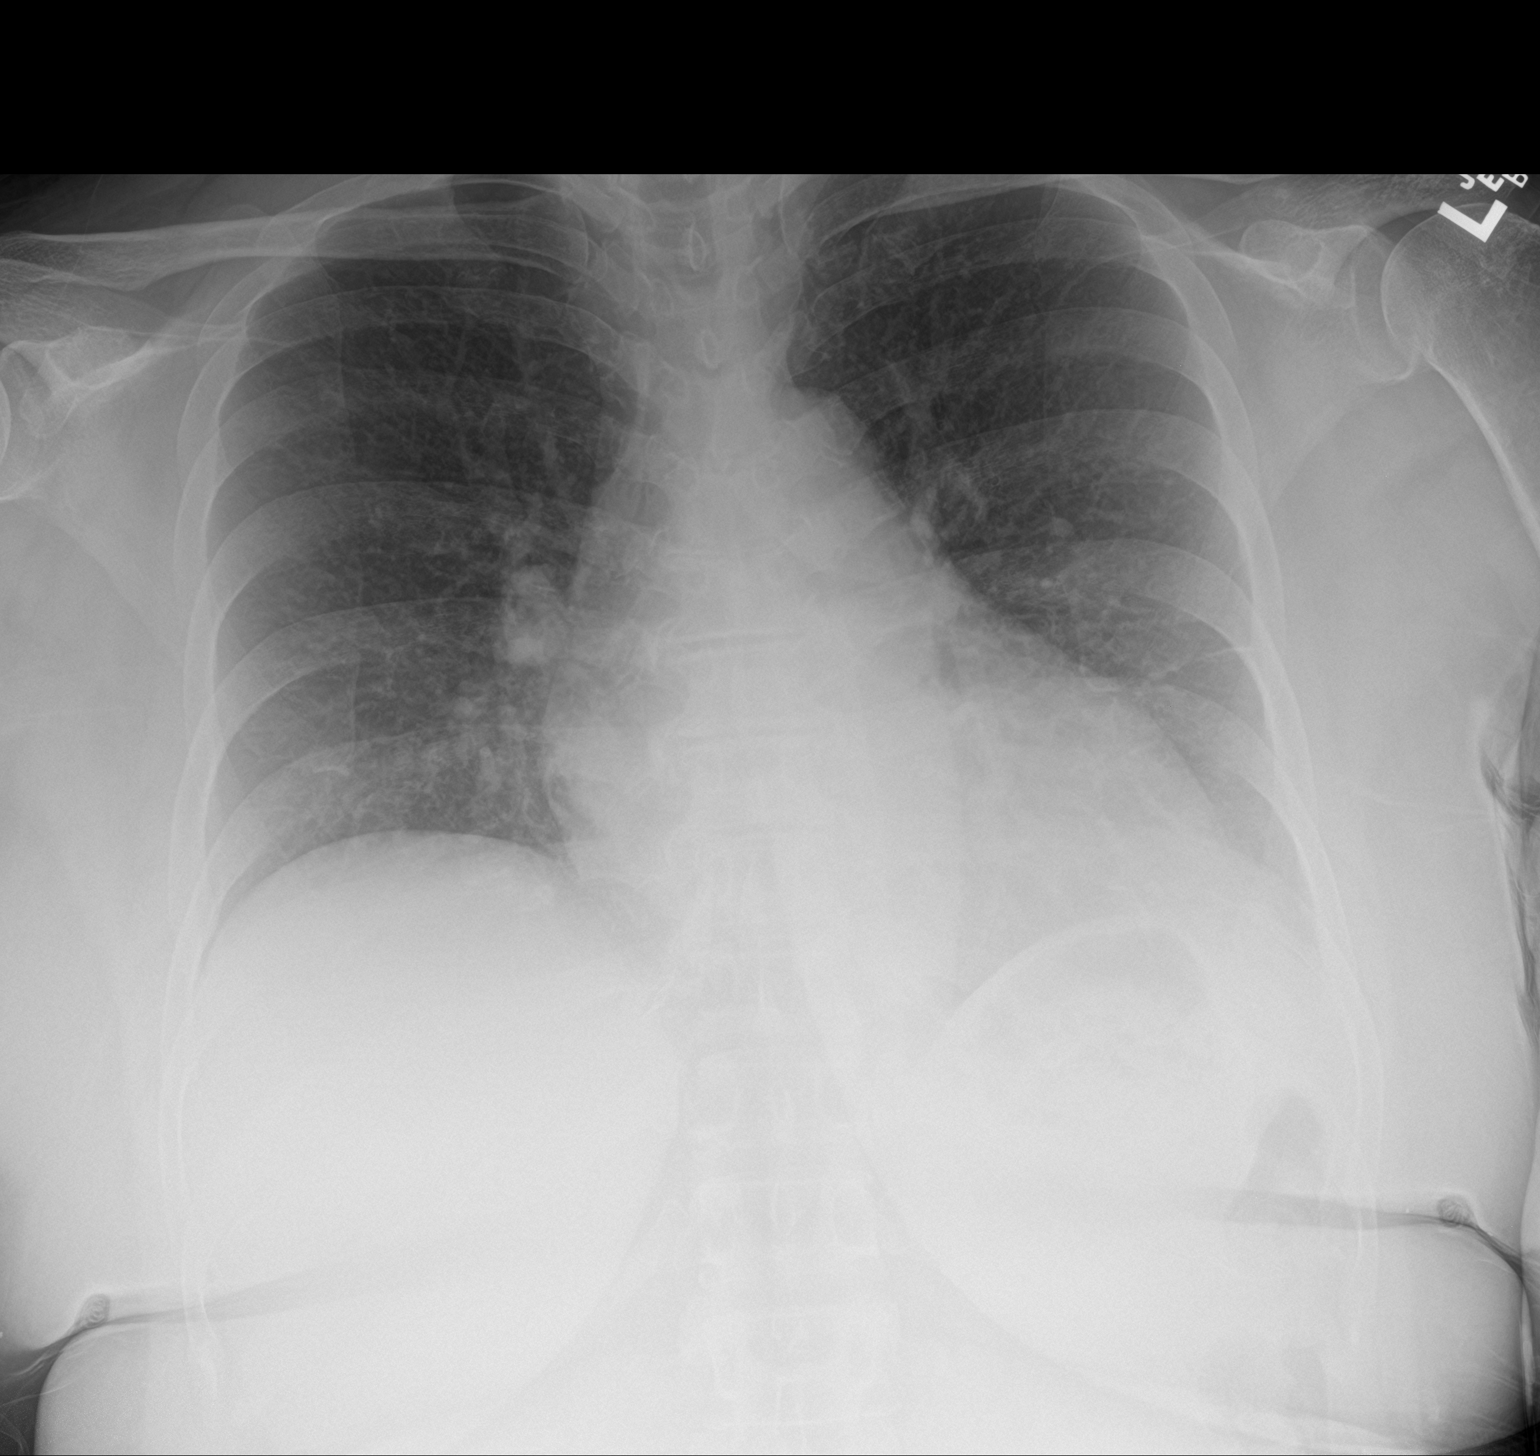

[2 of 2 positions shown; findings below may reference images not displayed]

FINDINGS: Mild cardiac enlargement. No signs of pleural effusion or edema. No
airspace opacities identified. The visualized osseous structures are
unremarkable.
IMPRESSION: No active cardiopulmonary abnormalities.

## 2023-09-16 DIAGNOSIS — E7849 Other hyperlipidemia: Secondary | ICD-10-CM | POA: Diagnosis not present

## 2023-09-16 DIAGNOSIS — Z131 Encounter for screening for diabetes mellitus: Secondary | ICD-10-CM | POA: Diagnosis not present

## 2023-09-16 DIAGNOSIS — I6789 Other cerebrovascular disease: Secondary | ICD-10-CM | POA: Diagnosis not present

## 2023-09-16 DIAGNOSIS — Z1211 Encounter for screening for malignant neoplasm of colon: Secondary | ICD-10-CM | POA: Diagnosis not present

## 2023-09-16 DIAGNOSIS — I10 Essential (primary) hypertension: Secondary | ICD-10-CM | POA: Diagnosis not present

## 2023-09-16 DIAGNOSIS — E559 Vitamin D deficiency, unspecified: Secondary | ICD-10-CM | POA: Diagnosis not present

## 2023-09-16 DIAGNOSIS — F4321 Adjustment disorder with depressed mood: Secondary | ICD-10-CM | POA: Diagnosis not present

## 2023-09-16 DIAGNOSIS — Z1239 Encounter for other screening for malignant neoplasm of breast: Secondary | ICD-10-CM | POA: Diagnosis not present

## 2023-09-16 DIAGNOSIS — Z0000 Encounter for general adult medical examination without abnormal findings: Secondary | ICD-10-CM | POA: Diagnosis not present

## 2023-09-16 DIAGNOSIS — Z23 Encounter for immunization: Secondary | ICD-10-CM | POA: Diagnosis not present

## 2023-10-02 DIAGNOSIS — Z419 Encounter for procedure for purposes other than remedying health state, unspecified: Secondary | ICD-10-CM | POA: Diagnosis not present

## 2023-11-02 DIAGNOSIS — Z419 Encounter for procedure for purposes other than remedying health state, unspecified: Secondary | ICD-10-CM | POA: Diagnosis not present

## 2023-11-30 DIAGNOSIS — Z419 Encounter for procedure for purposes other than remedying health state, unspecified: Secondary | ICD-10-CM | POA: Diagnosis not present

## 2024-01-11 DIAGNOSIS — Z419 Encounter for procedure for purposes other than remedying health state, unspecified: Secondary | ICD-10-CM | POA: Diagnosis not present

## 2024-02-10 DIAGNOSIS — Z419 Encounter for procedure for purposes other than remedying health state, unspecified: Secondary | ICD-10-CM | POA: Diagnosis not present

## 2024-03-12 DIAGNOSIS — Z419 Encounter for procedure for purposes other than remedying health state, unspecified: Secondary | ICD-10-CM | POA: Diagnosis not present

## 2024-04-11 DIAGNOSIS — Z419 Encounter for procedure for purposes other than remedying health state, unspecified: Secondary | ICD-10-CM | POA: Diagnosis not present

## 2024-05-12 DIAGNOSIS — Z419 Encounter for procedure for purposes other than remedying health state, unspecified: Secondary | ICD-10-CM | POA: Diagnosis not present

## 2024-06-12 DIAGNOSIS — Z419 Encounter for procedure for purposes other than remedying health state, unspecified: Secondary | ICD-10-CM | POA: Diagnosis not present
